# Patient Record
Sex: Female | Born: 1945 | Race: White | Hispanic: No | Marital: Married | Smoking: Never smoker
Health system: Southern US, Community
[De-identification: ages and names within clinical notes are randomized; demographics above are authoritative.]

## PROBLEM LIST (undated history)

## (undated) DIAGNOSIS — M1990 Unspecified osteoarthritis, unspecified site: Secondary | ICD-10-CM

## (undated) DIAGNOSIS — D649 Anemia, unspecified: Secondary | ICD-10-CM

## (undated) DIAGNOSIS — M797 Fibromyalgia: Secondary | ICD-10-CM

## (undated) DIAGNOSIS — F329 Major depressive disorder, single episode, unspecified: Secondary | ICD-10-CM

## (undated) DIAGNOSIS — N3010 Interstitial cystitis (chronic) without hematuria: Secondary | ICD-10-CM

## (undated) DIAGNOSIS — IMO0002 Reserved for concepts with insufficient information to code with codable children: Secondary | ICD-10-CM

## (undated) DIAGNOSIS — N3281 Overactive bladder: Secondary | ICD-10-CM

## (undated) DIAGNOSIS — F32A Depression, unspecified: Secondary | ICD-10-CM

## (undated) DIAGNOSIS — F419 Anxiety disorder, unspecified: Secondary | ICD-10-CM

## (undated) DIAGNOSIS — E785 Hyperlipidemia, unspecified: Secondary | ICD-10-CM

## (undated) HISTORY — DX: Hyperlipidemia, unspecified: E78.5

## (undated) HISTORY — DX: Overactive bladder: N32.81

## (undated) HISTORY — DX: Anemia, unspecified: D64.9

## (undated) HISTORY — PX: TONSILLECTOMY: SHX5217

## (undated) HISTORY — DX: Anxiety disorder, unspecified: F41.9

## (undated) HISTORY — PX: ABDOMINAL HYSTERECTOMY: SHX81

## (undated) HISTORY — DX: Fibromyalgia: M79.7

## (undated) HISTORY — DX: Reserved for concepts with insufficient information to code with codable children: IMO0002

## (undated) HISTORY — DX: Depression, unspecified: F32.A

## (undated) HISTORY — DX: Major depressive disorder, single episode, unspecified: F32.9

## (undated) HISTORY — PX: CHOLECYSTECTOMY: SHX55

## (undated) HISTORY — DX: Unspecified osteoarthritis, unspecified site: M19.90

## (undated) HISTORY — PX: BLADDER SUSPENSION: SHX72

## (undated) HISTORY — PX: BREAST LUMPECTOMY: SHX2

## (undated) HISTORY — DX: Interstitial cystitis (chronic) without hematuria: N30.10

---

## 2007-01-31 ENCOUNTER — Encounter: Admission: RE | Admit: 2007-01-31 | Discharge: 2007-01-31 | Payer: Self-pay | Admitting: Specialist

## 2007-03-15 ENCOUNTER — Encounter: Payer: Self-pay | Admitting: Urology

## 2007-03-15 ENCOUNTER — Ambulatory Visit (HOSPITAL_BASED_OUTPATIENT_CLINIC_OR_DEPARTMENT_OTHER): Admission: RE | Admit: 2007-03-15 | Discharge: 2007-03-15 | Payer: Self-pay | Admitting: Urology

## 2007-07-28 ENCOUNTER — Encounter: Admission: RE | Admit: 2007-07-28 | Discharge: 2007-07-28 | Payer: Self-pay | Admitting: Family Medicine

## 2008-04-23 ENCOUNTER — Ambulatory Visit: Payer: Self-pay | Admitting: Internal Medicine

## 2008-04-23 DIAGNOSIS — F411 Generalized anxiety disorder: Secondary | ICD-10-CM | POA: Insufficient documentation

## 2008-04-23 DIAGNOSIS — D649 Anemia, unspecified: Secondary | ICD-10-CM | POA: Insufficient documentation

## 2008-04-23 DIAGNOSIS — M1990 Unspecified osteoarthritis, unspecified site: Secondary | ICD-10-CM | POA: Insufficient documentation

## 2008-04-23 DIAGNOSIS — K279 Peptic ulcer, site unspecified, unspecified as acute or chronic, without hemorrhage or perforation: Secondary | ICD-10-CM | POA: Insufficient documentation

## 2008-04-23 DIAGNOSIS — E785 Hyperlipidemia, unspecified: Secondary | ICD-10-CM | POA: Insufficient documentation

## 2008-04-23 DIAGNOSIS — F329 Major depressive disorder, single episode, unspecified: Secondary | ICD-10-CM

## 2008-04-23 DIAGNOSIS — R32 Unspecified urinary incontinence: Secondary | ICD-10-CM | POA: Insufficient documentation

## 2008-04-23 LAB — CONVERTED CEMR LAB
AST: 26 units/L (ref 0–37)
Albumin: 4 g/dL (ref 3.5–5.2)
Bilirubin, Direct: 0.1 mg/dL (ref 0.0–0.3)
Calcium: 9.4 mg/dL (ref 8.4–10.5)
Cholesterol: 240 mg/dL (ref 0–200)
Creatinine, Ser: 0.9 mg/dL (ref 0.4–1.2)
Direct LDL: 139.3 mg/dL
Eosinophils Absolute: 0.2 10*3/uL (ref 0.0–0.7)
Eosinophils Relative: 2.7 % (ref 0.0–5.0)
GFR calc Af Amer: 81 mL/min
HCT: 38.8 % (ref 36.0–46.0)
Hemoglobin: 13.2 g/dL (ref 12.0–15.0)
Iron: 71 ug/dL (ref 42–145)
MCHC: 34 g/dL (ref 30.0–36.0)
MCV: 86.7 fL (ref 78.0–100.0)
Monocytes Absolute: 0.5 10*3/uL (ref 0.1–1.0)
Monocytes Relative: 6.8 % (ref 3.0–12.0)
Neutrophils Relative %: 61.1 % (ref 43.0–77.0)
RBC: 4.47 M/uL (ref 3.87–5.11)
RDW: 12.8 % (ref 11.5–14.6)
Sodium: 141 meq/L (ref 135–145)
Total Protein: 6.6 g/dL (ref 6.0–8.3)
Transferrin: 255.1 mg/dL (ref 212.0–360.0)

## 2008-08-07 ENCOUNTER — Encounter: Admission: RE | Admit: 2008-08-07 | Discharge: 2008-08-07 | Payer: Self-pay | Admitting: Internal Medicine

## 2009-01-30 ENCOUNTER — Ambulatory Visit: Payer: Self-pay | Admitting: Internal Medicine

## 2009-07-24 ENCOUNTER — Ambulatory Visit: Payer: Self-pay | Admitting: Internal Medicine

## 2009-07-24 LAB — CONVERTED CEMR LAB
ALT: 18 units/L (ref 0–35)
Albumin: 4.6 g/dL (ref 3.5–5.2)
Alkaline Phosphatase: 73 units/L (ref 39–117)
Basophils Absolute: 0 10*3/uL (ref 0.0–0.1)
Basophils Relative: 0.5 % (ref 0.0–3.0)
Bilirubin, Direct: 0.1 mg/dL (ref 0.0–0.3)
CO2: 31 meq/L (ref 19–32)
Calcium: 10.5 mg/dL (ref 8.4–10.5)
Direct LDL: 145.3 mg/dL
Eosinophils Absolute: 0.2 10*3/uL (ref 0.0–0.7)
Eosinophils Relative: 2 % (ref 0.0–5.0)
Glucose, Bld: 89 mg/dL (ref 70–99)
HCT: 38.4 % (ref 36.0–46.0)
Hemoglobin: 13.3 g/dL (ref 12.0–15.0)
MCHC: 34.7 g/dL (ref 30.0–36.0)
Monocytes Absolute: 0.7 10*3/uL (ref 0.1–1.0)
Monocytes Relative: 7.6 % (ref 3.0–12.0)
Neutro Abs: 6 10*3/uL (ref 1.4–7.7)
Neutrophils Relative %: 69.2 % (ref 43.0–77.0)
Platelets: 302 10*3/uL (ref 150.0–400.0)
Potassium: 4.7 meq/L (ref 3.5–5.1)
RBC: 4.4 M/uL (ref 3.87–5.11)
RDW: 13.1 % (ref 11.5–14.6)
Total Bilirubin: 0.6 mg/dL (ref 0.3–1.2)
Total CHOL/HDL Ratio: 3
Total Protein, Urine: NEGATIVE mg/dL
Total Protein: 7 g/dL (ref 6.0–8.3)
VLDL: 21 mg/dL (ref 0.0–40.0)

## 2009-07-25 ENCOUNTER — Encounter: Payer: Self-pay | Admitting: Internal Medicine

## 2009-08-15 ENCOUNTER — Encounter: Admission: RE | Admit: 2009-08-15 | Discharge: 2009-08-15 | Payer: Self-pay | Admitting: Internal Medicine

## 2009-08-15 LAB — HM MAMMOGRAPHY

## 2010-01-13 ENCOUNTER — Ambulatory Visit: Payer: Self-pay | Admitting: Internal Medicine

## 2010-03-11 NOTE — Assessment & Plan Note (Signed)
Summary: f/u appt/#/cd   Vital Signs:  Patient profile:   65 year old female Height:      67 inches Weight:      156 pounds BMI:     24.52 O2 Sat:      97 % on Room air Temp:     98.3 degrees F oral Pulse rate:   83 / minute Pulse rhythm:   regular Resp:     16 per minute BP sitting:   136 / 82  (left arm) Cuff size:   large  Vitals Entered By: Rock Nephew CMA (July 24, 2009 1:18 PM)  O2 Flow:  Room air  Primary Care Provider:  Etta Grandchild MD   History of Present Illness: Routine physical, no complaints.  Preventive Screening-Counseling & Management  Alcohol-Tobacco     Alcohol drinks/day: <1     Alcohol type: wine     >5/day in last 3 mos: no     Alcohol Counseling: not indicated; use of alcohol is not excessive or problematic     Smoking Status: never     Smoking Cessation Counseling: no  Caffeine-Diet-Exercise     Does Patient Exercise: no  Hep-HIV-STD-Contraception     Hepatitis Risk: no risk noted     HIV Risk: no risk noted     STD Risk: no risk noted     SBE monthly: yes     SBE Education/Counseling: to perform regular SBE     Sun Exposure-Excessive: yes     Sun Exposure Counseling: to decrease sun exposure  Safety-Violence-Falls     Seat Belt Use: yes     Firearms in the Home: no firearms in the home     Smoke Detectors: yes     Violence in the Home: no risk noted     Sexual Abuse: no     Fall Risk Counseling: not indicated; no significant falls noted      Sexual History:  currently monogamous.        Drug Use:  no.        Blood Transfusions:  prior to 2001.    Medications Prior to Update: 1)  Prevacid 30 Mg Cpdr (Lansoprazole) .... Take 1 Tablet By Mouth Two Times A Day 2)  Nitrofurantoin Monohyd Macro 100 Mg Caps (Nitrofurantoin Monohyd Macro) .... Take 1 Tab By Mouth At Bedtime 3)  Cymbalta 30 Mg Cpep (Duloxetine Hcl) .... Once Daily 4)  Vesicare 10 Mg Tabs (Solifenacin Succinate) .... Take 1 Tablet By Mouth Once A Day 5)   Tretinoin 0.025 % Crea (Tretinoin) .... Apply As Directed Bid 6)  Tussicaps 10-8 Mg Xr12h-Cap (Hydrocod Polst-Chlorphen Polst) .... One By Mouth Two Times A Day As Needed For Cough  Current Medications (verified): 1)  Prevacid 30 Mg Cpdr (Lansoprazole) .... Take 1 Tablet By Mouth Two Times A Day 2)  Nitrofurantoin Monohyd Macro 100 Mg Caps (Nitrofurantoin Monohyd Macro) .... Take 1 Tab By Mouth At Bedtime 3)  Cymbalta 30 Mg Cpep (Duloxetine Hcl) .... Once Daily  Allergies (verified): 1)  ! Iodine 2)  ! * Hep B Vaccine  Past History:  Past Medical History: Last updated: 04/23/2008 Anemia-NOS Anxiety Depression Peptic ulcer disease Urinary incontinence OAB Interstitial cystitis Osteoarthritis Hyperlipidemia  Past Surgical History: Last updated: 04/23/2008 Cholecystectomy Hysterectomy Lumpectomy-right Tonsillectomy  Family History: Last updated: 04/23/2008 Family History of CAD Female 1st degree relative <50 Family History High cholesterol Family History Hypertension  Social History: Last updated: 04/23/2008 Retired Married Never Smoked Alcohol use-no  Drug use-no Regular exercise-no  Risk Factors: Alcohol Use: <1 (07/24/2009) >5 drinks/d w/in last 3 months: no (07/24/2009) Exercise: no (07/24/2009)  Risk Factors: Smoking Status: never (07/24/2009)  Family History: Reviewed history from 04/23/2008 and no changes required. Family History of CAD Female 1st degree relative <50 Family History High cholesterol Family History Hypertension  Social History: Reviewed history from 04/23/2008 and no changes required. Retired Married Never Smoked Alcohol use-no Drug use-no Regular exercise-no Sun Exposure-Excessive:  yes  Review of Systems       The patient complains of weight gain.  The patient denies anorexia, fever, chest pain, syncope, dyspnea on exertion, peripheral edema, prolonged cough, headaches, hemoptysis, abdominal pain, hematuria, difficulty walking,  depression, and enlarged lymph nodes.   GI:  Denies abdominal pain, bloody stools, change in bowel habits, dark tarry stools, diarrhea, hemorrhoids, indigestion, loss of appetite, nausea, vomiting, and vomiting blood. Heme:  Denies abnormal bruising, bleeding, enlarge lymph nodes, fevers, pallor, and skin discoloration.  Physical Exam  General:  alert, well-developed, well-nourished, well-hydrated, appropriate dress, normal appearance, healthy-appearing, cooperative to examination, and good hygiene.   Head:  normocephalic, atraumatic, no abnormalities observed, and no abnormalities palpated.   Eyes:  vision grossly intact and pupils equal.   Mouth:  Oral mucosa and oropharynx without lesions or exudates.  Teeth in good repair. Neck:  supple, full ROM, no masses, no thyroid nodules or tenderness, no JVD, and no cervical lymphadenopathy.   Breasts:  skin/areolae normal, no masses, no abnormal thickening, no nipple discharge, no tenderness, and no adenopathy.   Lungs:  normal respiratory effort, no intercostal retractions, no accessory muscle use, normal breath sounds, no dullness, no fremitus, no crackles, and no wheezes.   Heart:  normal rate, regular rhythm, no murmur, no gallop, no rub, and no JVD.   Abdomen:  soft, non-tender, normal bowel sounds, no distention, no masses, no guarding, no rigidity, no rebound tenderness, no abdominal hernia, no inguinal hernia, no hepatomegaly, no splenomegaly, and abdominal scar(s).   Rectal:  No external abnormalities noted. Normal sphincter tone. No rectal masses or tenderness. Msk:  normal ROM, no joint tenderness, no joint swelling, no joint warmth, no redness over joints, no joint deformities, no joint instability, no crepitation, and no muscle atrophy.   Pulses:  R and L carotid,radial,femoral,dorsalis pedis and posterior tibial pulses are full and equal bilaterally Extremities:  No clubbing, cyanosis, edema, or deformity noted with normal full range of  motion of all joints.   Neurologic:  No cranial nerve deficits noted. Station and gait are normal. Plantar reflexes are down-going bilaterally. DTRs are symmetrical throughout. Sensory, motor and coordinative functions appear intact. Skin:  turgor normal, no rashes, no suspicious lesions, no ecchymoses, no petechiae, no purpura, no ulcerations, no edema, and excessive tan.   Cervical Nodes:  no anterior cervical adenopathy and no posterior cervical adenopathy.   Axillary Nodes:  no R axillary adenopathy and no L axillary adenopathy.   Inguinal Nodes:  no R inguinal adenopathy and no L inguinal adenopathy.   Psych:  Oriented X3, memory intact for recent and remote, normally interactive, good eye contact, not depressed appearing, not agitated, not suicidal, and moderately anxious.     Impression & Recommendations:  Problem # 1:  ROUTINE GENERAL MEDICAL [email or web address]  CARE FACL (ICD-V70.0) Assessment Unchanged  Orders: Venipuncture ([address]) TLB-Lipid Panel (80061-LIPID) TLB-BMP (Basic Metabolic Panel-BMET) (80048-METABOL) TLB-CBC Platelet - w/Differential (85025-CBCD) TLB-Hepatic/Liver Function Pnl (80076-HEPATIC) TLB-TSH (Thyroid Stimulating Hormone) (84443-TSH) TLB-Udip w/ Micro (81001-URINE) Hemoccult Guaiac-1 spec.(in office) (82270)  EKG w/ Interpretation (93000)  Mammogram: ASSESSMENT: Negative - BI-RADS 1^MM DIGITAL SCREENING (08/07/2008) Pap smear: Normal (03/23/2006) Chol: 240 (04/23/2008)   HDL: 79.6 (04/23/2008)   LDL: DEL (04/23/2008)   TG: 91 (04/23/2008) TSH: 0.84 (04/23/2008)    Discussed using sunscreen, use of alcohol, drug use, self breast exam, routine dental care, routine eye care, schedule for GYN exam, routine physical exam, seat belts, multiple vitamins, osteoporosis prevention, adequate calcium intake in diet, recommendations for immunizations, mammograms and Pap smears.  Discussed exercise and checking cholesterol.    Complete Medication List: 1)  Prevacid 30 Mg Cpdr  (Lansoprazole) .... Take 1 tablet by mouth two times a day 2)  Nitrofurantoin Monohyd Macro 100 Mg Caps (Nitrofurantoin monohyd macro) .... Take 1 tab by mouth at bedtime 3)  Cymbalta 30 Mg Cpep (Duloxetine hcl) .... Once daily  Colorectal Screening:  Current Recommendations:    Hemoccult: NEG X 1 today  PAP Screening:    Hx Cervical Dysplasia in last 5 yrs? No    3 normal PAP smears in last 5 yrs? Yes    Last PAP smear:  03/23/2006    Reviewed PAP smear recommendations:  patient refuses understanding risks of delayed diagnosis  PAP Smear Results:    Date of Exam:  03/23/2006    Results:  Normal  Mammogram Screening:    Last Mammogram:  08/07/2008  Osteoporosis Risk Assessment:  Risk Factors for Fracture or Low Bone Density:   Race (White or Asian):     yes   Hx of Fractures:       no   FH of Osteoporosis:     no   Hx of falls:       no   Physically inactive:     yes   Smoking status:       never   High alcohol use:     no   High caffeine use:     no   Low calcium/Vit. D intake:   no   Corticosteroid use:     no   Thyroid replacement:     no   Dilantin use:       no   Heparin use:       no   Thyroid disease:     no   Rheumatoid Arthritis:     no   Parathyroid disease:     no  Patient Instructions: 1)  Please schedule a follow-up appointment in 2 months. 2)  It is important that you exercise regularly at least 20 minutes 5 times a week. If you develop chest pain, have severe difficulty breathing, or feel very tired , stop exercising immediately and seek medical attention. 3)  You need to lose weight. Consider a lower calorie diet and regular exercise.  4)  Check your Blood Pressure regularly. If it is above 140/90: you should make an appointment.    Appended Document: f/u appt/#/cd    Clinical Lists Changes  Orders: Added new Service order of Tdap => 18yrs IM (67893) - Signed Added new Service order of Admin 1st Vaccine (81017) - Signed Observations: Added  new observation of TD BOOST VIS: 12/28/06 version given July 24, 2009. (07/24/2009 16:48) Added new observation of TD BOOSTERLO: [phone] (07/24/2009 16:48) Added new observation of TD BOOST EXP: 05/03/2011 (07/24/2009 16:48) Added new observation of TD BOOSTERBY: Lakisha Archie CMA (07/24/2009 16:48) Added new observation of TD BOOSTERRT: IM (07/24/2009 16:48) Added new observation of TDBOOSTERDSE: 0.5 ml (07/24/2009 16:48) Added new observation of  TD BOOSTERMF: GlaxoSmithKline (07/24/2009 16:48) Added new observation of TD BOOST SIT: left deltoid (07/24/2009 16:48) Added new observation of TD BOOSTER: Tdap (07/24/2009 16:48)       Immunizations Administered:  Tetanus Vaccine:    Vaccine Type: Tdap    Site: left deltoid    Mfr: GlaxoSmithKline    Dose: 0.5 ml    Route: IM    Given by: Rock Nephew CMA    Exp. Date: 05/03/2011    Lot #: [id number]    VIS given: 12/28/06 version given July 24, 2009.

## 2010-03-11 NOTE — Letter (Signed)
Summary: Results Follow-up Letter  [facility]  [address] [place], [place] [address]   Phone: [phone]  Fax: [phone]    07/25/2009  [address] [place], [place]  [address]  Dear Ms. Higby,   The following are the results of your recent test(s):  Test     Result Thyroid     normal Liver/kidney   normal CBC       normal Urine       normal  _________________________________________________________  Please call for an appointment as directed _________________________________________________________ _________________________________________________________ _________________________________________________________  Sincerely,  Sanda Linger MD [facility]

## 2010-03-11 NOTE — Assessment & Plan Note (Signed)
Summary: COUGH--SORE THROAT  STC   Vital Signs:  Patient profile:   65 year old female Menstrual status:  postmenopausal Height:      67 inches Weight:      159.50 pounds BMI:     25.07 O2 Sat:      97 % Temp:     98.7 degrees F oral Pulse rate:   72 / minute Pulse rhythm:   regular Resp:     16 per minute BP sitting:   126 / 70  (left arm) Cuff size:   large  Vitals Entered By: Rock Nephew CMA (January 13, 2010 11:26 AM)  Nutrition Counseling: Patient's BMI is greater than 25 and therefore counseled on weight management options. CC: Patient c/o cough w/green pjlem, congestion, sore throat x 01/04/10, URI symptoms Is Patient Diabetic? No Pain Assessment Patient in pain? no       Does patient need assistance? Functional Status Self care Ambulation Normal     Menstrual Status postmenopausal Last PAP Result Normal   Primary Care Provider:  Etta Grandchild MD  CC:  Patient c/o cough w/green pjlem, congestion, sore throat x 01/04/10, and URI symptoms.  History of Present Illness:  URI Symptoms      This is a 65 year old woman who presents with URI symptoms.  The symptoms began 2 weeks ago.  The severity is described as moderate.  The patient reports sore throat, productive cough, and sick contacts, but denies nasal congestion, clear nasal discharge, purulent nasal discharge, dry cough, and earache.  The patient denies fever, stiff neck, dyspnea, wheezing, rash, vomiting, diarrhea, use of an antipyretic, and response to antipyretic.  The patient denies itchy throat, sneezing, seasonal symptoms, headache, muscle aches, and severe fatigue.  Risk factors for Strep sinusitis include double sickening.  The patient denies the following risk factors for Strep sinusitis: unilateral facial pain, unilateral nasal discharge, poor response to decongestant, tooth pain, Strep exposure, tender adenopathy, and absence of cough.    Preventive Screening-Counseling &  Management  Alcohol-Tobacco     Alcohol drinks/day: <1     Alcohol type: wine     >5/day in last 3 mos: no     Alcohol Counseling: not indicated; use of alcohol is not excessive or problematic     Smoking Status: never     Smoking Cessation Counseling: no     Tobacco Counseling: not indicated; no tobacco use  Hep-HIV-STD-Contraception     Hepatitis Risk: no risk noted     HIV Risk: no risk noted     STD Risk: no risk noted     SBE monthly: yes     SBE Education/Counseling: to perform regular SBE     Sun Exposure-Excessive: yes     Sun Exposure Counseling: to decrease sun exposure      Sexual History:  currently monogamous.        Drug Use:  no.        Blood Transfusions:  prior to 2001.    Clinical Review Panels:  Prevention   Last Mammogram:  ASSESSMENT: Negative - BI-RADS 1^MM DIGITAL SCREENING (08/15/2009)   Last Pap Smear:  Normal (03/23/2006)  Immunizations   Last Tetanus Booster:  Tdap (07/24/2009)   Last Zoster Vaccine:  Zostavax (01/13/2010)  Lipid Management   Cholesterol:  280 (07/24/2009)   LDL (bad choesterol):  DEL (04/23/2008)   HDL (good cholesterol):  99.10 (07/24/2009)  Diabetes Management   Creatinine:  0.7 (07/24/2009)  CBC  WBC:  8.6 (07/24/2009)   RBC:  4.40 (07/24/2009)   Hgb:  13.3 (07/24/2009)   Hct:  38.4 (07/24/2009)   Platelets:  302.0 (07/24/2009)   MCV  87.4 (07/24/2009)   MCHC  34.7 (07/24/2009)   RDW  13.1 (07/24/2009)   PMN:  69.2 (07/24/2009)   Lymphs:  20.7 (07/24/2009)   Monos:  7.6 (07/24/2009)   Eosinophils:  2.0 (07/24/2009)   Basophil:  0.5 (07/24/2009)  Complete Metabolic Panel   Glucose:  89 (07/24/2009)   Sodium:  140 (07/24/2009)   Potassium:  4.7 (07/24/2009)   Chloride:  101 (07/24/2009)   CO2:  31 (07/24/2009)   BUN:  21 (07/24/2009)   Creatinine:  0.7 (07/24/2009)   Albumin:  4.6 (07/24/2009)   Total Protein:  7.0 (07/24/2009)   Calcium:  10.5 (07/24/2009)   Total Bili:  0.6 (07/24/2009)   Alk Phos:   73 (07/24/2009)   SGPT (ALT):  18 (07/24/2009)   SGOT (AST):  21 (07/24/2009)   Medications Prior to Update: 1)  Prevacid 30 Mg Cpdr (Lansoprazole) .... Take 1 Tablet By Mouth Two Times A Day 2)  Nitrofurantoin Monohyd Macro 100 Mg Caps (Nitrofurantoin Monohyd Macro) .... Take 1 Tab By Mouth At Bedtime 3)  Cymbalta 30 Mg Cpep (Duloxetine Hcl) .... Once Daily  Current Medications (verified): 1)  Prevacid 30 Mg Cpdr (Lansoprazole) .... Take 1 Tablet By Mouth Two Times A Day 2)  Nitrofurantoin Monohyd Macro 100 Mg Caps (Nitrofurantoin Monohyd Macro) .... Take 1 Tab By Mouth At Bedtime 3)  Cymbalta 30 Mg Cpep (Duloxetine Hcl) .... Once Daily 4)  Zithromax Tri-Pak 500 Mg Tab (Azithromycin) .... Take One By Mouth Once Daily For 3 Days 5)  Mytussin Ac 100-10 Mg/13ml Syrp (Guaifenesin-Codeine) .... 5-10 Ml By Mouth Qid As Needed For Cough  Allergies (verified): 1)  ! Iodine 2)  ! * Hep B Vaccine  Past History:  Past Medical History: Last updated: 04/23/2008 Anemia-NOS Anxiety Depression Peptic ulcer disease Urinary incontinence OAB Interstitial cystitis Osteoarthritis Hyperlipidemia  Past Surgical History: Last updated: 04/23/2008 Cholecystectomy Hysterectomy Lumpectomy-right Tonsillectomy  Family History: Last updated: 04/23/2008 Family History of CAD Female 1st degree relative <50 Family History High cholesterol Family History Hypertension  Social History: Last updated: 04/23/2008 Retired Married Never Smoked Alcohol use-no Drug use-no Regular exercise-no  Risk Factors: Alcohol Use: <1 (01/13/2010) >5 drinks/d w/in last 3 months: no (01/13/2010) Exercise: no (07/24/2009)  Risk Factors: Smoking Status: never (01/13/2010)  Family History: Reviewed history from 04/23/2008 and no changes required. Family History of CAD Female 1st degree relative <50 Family History High cholesterol Family History Hypertension  Social History: Reviewed history from 04/23/2008  and no changes required. Retired Married Never Smoked Alcohol use-no Drug use-no Regular exercise-no  Review of Systems  The patient denies anorexia, fever, weight loss, weight gain, chest pain, syncope, dyspnea on exertion, peripheral edema, prolonged cough, headaches, hemoptysis, abdominal pain, suspicious skin lesions, unusual weight change, enlarged lymph nodes, and angioedema.    Physical Exam  General:  alert, well-developed, well-nourished, well-hydrated, appropriate dress, normal appearance, healthy-appearing, cooperative to examination, and good hygiene.   Head:  normocephalic, atraumatic, no abnormalities observed, and no abnormalities palpated.   Ears:  R ear normal and L ear normal.   Mouth:  Oral mucosa and oropharynx without lesions or exudates.  Teeth in good repair. Neck:  supple, full ROM, no masses, no thyroid nodules or tenderness, no JVD, and no cervical lymphadenopathy.   Lungs:  normal respiratory effort, no intercostal  retractions, no accessory muscle use, normal breath sounds, no dullness, no fremitus, no crackles, and no wheezes.   Heart:  normal rate, regular rhythm, no murmur, no gallop, no rub, and no JVD.   Abdomen:  soft, non-tender, normal bowel sounds, no distention, no masses, no guarding, no rigidity, no rebound tenderness, no abdominal hernia, no inguinal hernia, no hepatomegaly, no splenomegaly, and abdominal scar(s).   Msk:  normal ROM, no joint tenderness, no joint swelling, no joint warmth, no redness over joints, no joint deformities, no joint instability, no crepitation, and no muscle atrophy.   Pulses:  R and L carotid,radial,femoral,dorsalis pedis and posterior tibial pulses are full and equal bilaterally Extremities:  No clubbing, cyanosis, edema, or deformity noted with normal full range of motion of all joints.   Neurologic:  No cranial nerve deficits noted. Station and gait are normal. Plantar reflexes are down-going bilaterally. DTRs are  symmetrical throughout. Sensory, motor and coordinative functions appear intact. Skin:  turgor normal, no rashes, no suspicious lesions, no ecchymoses, no petechiae, no purpura, no ulcerations, no edema, and excessive tan.   Cervical Nodes:  no anterior cervical adenopathy and no posterior cervical adenopathy.   Psych:  Oriented X3, memory intact for recent and remote, normally interactive, good eye contact, not depressed appearing, not agitated, not suicidal, and moderately anxious.     Impression & Recommendations:  Problem # 1:  BRONCHITIS-ACUTE (ICD-466.0) Assessment New  Her updated medication list for this problem includes:    Zithromax Tri-pak 500 Mg Tab (Azithromycin) .Marland Kitchen... Take one by mouth once daily for 3 days    Mytussin Ac 100-10 Mg/48ml Syrp (Guaifenesin-codeine) .Marland Kitchen... 5-10 ml by mouth qid as needed for cough  Take antibiotics and other medications as directed. Encouraged to push clear liquids, get enough rest, and take acetaminophen as needed. To be seen in 5-7 days if no improvement, sooner if worse.  Complete Medication List: 1)  Prevacid 30 Mg Cpdr (Lansoprazole) .... Take 1 tablet by mouth two times a day 2)  Nitrofurantoin Monohyd Macro 100 Mg Caps (Nitrofurantoin monohyd macro) .... Take 1 tab by mouth at bedtime 3)  Cymbalta 30 Mg Cpep (Duloxetine hcl) .... Once daily 4)  Zithromax Tri-pak 500 Mg Tab (Azithromycin) .... Take one by mouth once daily for 3 days 5)  Mytussin Ac 100-10 Mg/27ml Syrp (Guaifenesin-codeine) .... 5-10 ml by mouth qid as needed for cough  Other Orders: Zoster (Shingles) Vaccine Live (40981) Admin 1st Vaccine (19147)  Patient Instructions: 1)  Please schedule a follow-up appointment in 1 month. 2)  Take your antibiotic as prescribed until ALL of it is gone, but stop if you develop a rash or swelling and contact our office as soon as possible. 3)  Acute bronchitis symptoms for less than 10 days are not helped by antibiotics. take over the  counter cough medications. call if no improvment in  5-7 days, sooner if increasing cough, fever, or new symptoms( shortness of breath, chest pain). Prescriptions: MYTUSSIN AC 100-10 MG/5ML SYRP (GUAIFENESIN-CODEINE) 5-10 ml by mouth QID as needed for cough  #8 ounces x 0   Entered and Authorized by:   Etta Grandchild MD   Signed by:   Etta Grandchild MD on 01/13/2010   Method used:   Print then Give to Patient   RxID:   [id number] ZITHROMAX TRI-PAK 500 MG TAB (AZITHROMYCIN) Take one by mouth once daily for 3 days  #3 x 0   Entered and Authorized by:   Bernadene Bell.  Yetta Barre MD   Signed by:   Etta Grandchild MD on 01/13/2010   Method used:   Print then Give to Patient   RxID:   [id number]    Orders Added: 1)  Zoster (Shingles) Vaccine Live [90736] 2)  Admin 1st Vaccine [90471] 3)  Est. Patient Level IV [14782]   Immunizations Administered:  Zostavax # 1:    Vaccine Type: Zostavax    Site: left deltoid    Mfr: Merck    Dose: 0.65    Route: IM    Given by: Rock Nephew CMA    Exp. Date: 09/13/2010    Lot #: [id number]    VIS given: 11/21/04 given January 13, 2010.   Immunizations Administered:  Zostavax # 1:    Vaccine Type: Zostavax    Site: left deltoid    Mfr: Merck    Dose: 0.65    Route: IM    Given by: Rock Nephew CMA    Exp. Date: 09/13/2010    Lot #: [id number]    VIS given: 11/21/04 given January 13, 2010.

## 2010-03-11 NOTE — Letter (Signed)
Summary: Lipid Letter  [facility]  [address] [place], [place] [address]   Phone: [phone]  Fax: [phone]    07/25/2009  Megon Kalina [address] [place], [place]  [address]  Dear Ms. Blumenfeld:  We have carefully reviewed your last lipid profile from 04/23/2008 and the results are noted below with a summary of recommendations for lipid management.    Cholesterol:       280     Goal: <200   HDL "good" Cholesterol:   10.27     Goal: >40   LDL "bad" Cholesterol:   145     Goal: <130   Triglycerides:       105.0     Goal: <150    AS I HAVE SAID BEFORE THE HIGH HDL IS A VERY GOOD THING! let me know if you want to take a cholesterol medication    TLC Diet (Therapeutic Lifestyle Change): Saturated Fats & Transfatty acids should be kept < 7% of total calories ***Reduce Saturated Fats Polyunstaurated Fat can be up to 10% of total calories Monounsaturated Fat Fat can be up to 20% of total calories Total Fat should be no greater than 25-35% of total calories Carbohydrates should be 50-60% of total calories Protein should be approximately 15% of total calories Fiber should be at least 20-30 grams a day ***Increased fiber may help lower LDL Total Cholesterol should be < 200mg /day Consider adding plant stanol/sterols to diet (example: Benacol spread) ***A higher intake of unsaturated fat may reduce Triglycerides and Increase HDL    Adjunctive Measures (may lower LIPIDS and reduce risk of Heart Attack) include: Aerobic Exercise (20-30 minutes 3-4 times a week) Limit Alcohol Consumption Weight Reduction Aspirin 75-81 mg a day by mouth (if not allergic or contraindicated) Dietary Fiber 20-30 grams a day by mouth     Current Medications: 1)    Prevacid 30 Mg Cpdr (Lansoprazole) .... Take 1 tablet by mouth two times a day 2)    Nitrofurantoin Monohyd Macro 100 Mg Caps (Nitrofurantoin monohyd macro) .... Take 1 tab by mouth at bedtime 3)    Cymbalta 30 Mg  Cpep (Duloxetine hcl) .... Once daily  If you have any questions, please call. We appreciate being able to work with you.   Sincerely,    [facility] Etta Grandchild MD

## 2010-03-17 ENCOUNTER — Encounter: Payer: Self-pay | Admitting: Internal Medicine

## 2010-03-17 ENCOUNTER — Encounter (INDEPENDENT_AMBULATORY_CARE_PROVIDER_SITE_OTHER): Payer: Self-pay | Admitting: *Deleted

## 2010-03-17 ENCOUNTER — Other Ambulatory Visit: Payer: Medicare Other

## 2010-03-17 ENCOUNTER — Other Ambulatory Visit: Payer: Self-pay | Admitting: Internal Medicine

## 2010-03-17 ENCOUNTER — Ambulatory Visit (INDEPENDENT_AMBULATORY_CARE_PROVIDER_SITE_OTHER): Payer: Medicare Other | Admitting: Internal Medicine

## 2010-03-17 DIAGNOSIS — K279 Peptic ulcer, site unspecified, unspecified as acute or chronic, without hemorrhage or perforation: Secondary | ICD-10-CM

## 2010-03-17 DIAGNOSIS — M1990 Unspecified osteoarthritis, unspecified site: Secondary | ICD-10-CM

## 2010-03-17 DIAGNOSIS — IMO0001 Reserved for inherently not codable concepts without codable children: Secondary | ICD-10-CM

## 2010-03-17 DIAGNOSIS — M2550 Pain in unspecified joint: Secondary | ICD-10-CM

## 2010-03-17 DIAGNOSIS — E785 Hyperlipidemia, unspecified: Secondary | ICD-10-CM

## 2010-03-17 DIAGNOSIS — D649 Anemia, unspecified: Secondary | ICD-10-CM

## 2010-03-17 LAB — CBC WITH DIFFERENTIAL/PLATELET
Basophils Absolute: 0 10*3/uL (ref 0.0–0.1)
Basophils Relative: 0.6 % (ref 0.0–3.0)
Eosinophils Absolute: 0.3 10*3/uL (ref 0.0–0.7)
Eosinophils Relative: 3.7 % (ref 0.0–5.0)
HCT: 37.9 % (ref 36.0–46.0)
Hemoglobin: 13.1 g/dL (ref 12.0–15.0)
Lymphocytes Relative: 27.3 % (ref 12.0–46.0)
Lymphs Abs: 2 10*3/uL (ref 0.7–4.0)
MCHC: 34.5 g/dL (ref 30.0–36.0)
MCV: 86.6 fl (ref 78.0–100.0)
Monocytes Absolute: 0.7 10*3/uL (ref 0.1–1.0)
Monocytes Relative: 9.3 % (ref 3.0–12.0)
Neutro Abs: 4.3 10*3/uL (ref 1.4–7.7)
Neutrophils Relative %: 59.1 % (ref 43.0–77.0)
Platelets: 277 10*3/uL (ref 150.0–400.0)
RBC: 4.38 Mil/uL (ref 3.87–5.11)
RDW: 13.1 % (ref 11.5–14.6)
WBC: 7.3 10*3/uL (ref 4.5–10.5)

## 2010-03-17 LAB — BASIC METABOLIC PANEL
BUN: 19 mg/dL (ref 6–23)
CO2: 29 mEq/L (ref 19–32)
Calcium: 9.8 mg/dL (ref 8.4–10.5)
Chloride: 104 mEq/L (ref 96–112)
GFR: 75.29 mL/min (ref >60.00)
Glucose, Bld: 73 mg/dL (ref 70–99)

## 2010-03-17 LAB — HIGH SENSITIVITY CRP: CRP, High Sensitivity: 4.69 mg/L (ref 0.00–5.00)

## 2010-03-17 LAB — IBC PANEL: Transferrin: 265.7 mg/dL (ref 212.0–360.0)

## 2010-03-17 LAB — TSH: TSH: 1.06 u[IU]/mL (ref 0.35–5.50)

## 2010-03-17 LAB — CONVERTED CEMR LAB
Anti Nuclear Antibody(ANA): NEGATIVE
Rhuematoid fact SerPl-aCnc: 12 intl units/mL (ref <=14)
Vit D, 25-Hydroxy: 21 ng/mL — ABNORMAL LOW (ref 30–89)

## 2010-03-17 LAB — SEDIMENTATION RATE: Sed Rate: 10 mm/hr (ref 0–22)

## 2010-03-17 LAB — B12 AND FOLATE PANEL
Folate: 23.5 ng/mL (ref >5.9)
Vitamin B-12: 865 pg/mL (ref 211–911)

## 2010-03-17 LAB — CK: Total CK: 80 U/L (ref 7–177)

## 2010-03-17 LAB — HEPATIC FUNCTION PANEL: ALT: 18 U/L (ref 0–35)

## 2010-03-18 ENCOUNTER — Encounter: Payer: Self-pay | Admitting: Internal Medicine

## 2010-03-19 ENCOUNTER — Telehealth: Payer: Self-pay | Admitting: Internal Medicine

## 2010-03-27 NOTE — Assessment & Plan Note (Signed)
Summary: JOINT PAIN  X  2 WKS--STC   Vital Signs:  Patient profile:   65 year old female Menstrual status:  postmenopausal Height:      67 inches Weight:      161 pounds BMI:     25.31 O2 Sat:      97 % on Room air Temp:     98.6 degrees F oral Pulse rate:   66 / minute Pulse rhythm:   regular Resp:     16 per minute BP sitting:   110 / 70  (left arm) Cuff size:   regular  Vitals Entered By: Lanier Prude, CMA(AAMA) (March 17, 2010 1:34 PM)  Nutrition Counseling: Patient's BMI is greater than 25 and therefore counseled on weight management options.  O2 Flow:  Room air CC: joint pain X 3 wks, fatigue and shooting pains in head Is Patient Diabetic? No Pain Assessment Patient in pain? yes      Comments pt is not taking MyTussin   Primary Care Provider:  Etta Grandchild MD  CC:  joint pain X 3 wks and fatigue and shooting pains in head.  History of Present Illness: She returns c/o a 3 week history of diffuse muscle and large joint pain with fatigue.  Preventive Screening-Counseling & Management  Alcohol-Tobacco     Alcohol drinks/day: 2     Alcohol type: wine     >5/day in last 3 mos: no     Alcohol Counseling: not indicated; use of alcohol is not excessive or problematic     Feels need to cut down: no     Feels annoyed by complaints: no     Feels guilty re: drinking: no     Needs 'eye opener' in am: no     Smoking Status: never     Smoking Cessation Counseling: no     Tobacco Counseling: not indicated; no tobacco use  Hep-HIV-STD-Contraception     Hepatitis Risk: no risk noted     HIV Risk: no risk noted     STD Risk: no risk noted     SBE monthly: yes     SBE Education/Counseling: to perform regular SBE     Sun Exposure-Excessive: yes     Sun Exposure Counseling: to decrease sun exposure      Sexual History:  currently monogamous.        Drug Use:  no.        Blood Transfusions:  prior to 2001.    Clinical Review Panels:  Prevention   Last  Mammogram:  ASSESSMENT: Negative - BI-RADS 1^MM DIGITAL SCREENING (08/15/2009)   Last Pap Smear:  Normal (03/23/2006)  Immunizations   Last Tetanus Booster:  Tdap (07/24/2009)   Last Zoster Vaccine:  Zostavax (01/13/2010)  Lipid Management   Cholesterol:  280 (07/24/2009)   LDL (bad choesterol):  DEL (04/23/2008)   HDL (good cholesterol):  99.10 (07/24/2009)  Diabetes Management   Creatinine:  0.7 (07/24/2009)  CBC   WBC:  8.6 (07/24/2009)   RBC:  4.40 (07/24/2009)   Hgb:  13.3 (07/24/2009)   Hct:  38.4 (07/24/2009)   Platelets:  302.0 (07/24/2009)   MCV  87.4 (07/24/2009)   MCHC  34.7 (07/24/2009)   RDW  13.1 (07/24/2009)   PMN:  69.2 (07/24/2009)   Lymphs:  20.7 (07/24/2009)   Monos:  7.6 (07/24/2009)   Eosinophils:  2.0 (07/24/2009)   Basophil:  0.5 (07/24/2009)  Complete Metabolic Panel   Glucose:  89 (07/24/2009)  Sodium:  140 (07/24/2009)   Potassium:  4.7 (07/24/2009)   Chloride:  101 (07/24/2009)   CO2:  31 (07/24/2009)   BUN:  21 (07/24/2009)   Creatinine:  0.7 (07/24/2009)   Albumin:  4.6 (07/24/2009)   Total Protein:  7.0 (07/24/2009)   Calcium:  10.5 (07/24/2009)   Total Bili:  0.6 (07/24/2009)   Alk Phos:  73 (07/24/2009)   SGPT (ALT):  18 (07/24/2009)   SGOT (AST):  21 (07/24/2009)   Medications Prior to Update: 1)  Prevacid 30 Mg Cpdr (Lansoprazole) .... Take 1 Tablet By Mouth Two Times A Day 2)  Nitrofurantoin Monohyd Macro 100 Mg Caps (Nitrofurantoin Monohyd Macro) .... Take 1 Tab By Mouth At Bedtime 3)  Cymbalta 30 Mg Cpep (Duloxetine Hcl) .... Once Daily 4)  Mytussin Ac 100-10 Mg/21ml Syrp (Guaifenesin-Codeine) .... 5-10 Ml By Mouth Qid As Needed For Cough  Current Medications (verified): 1)  Prevacid 30 Mg Cpdr (Lansoprazole) .... Take 1 Tablet By Mouth Two Times A Day 2)  Nitrofurantoin Monohyd Macro 100 Mg Caps (Nitrofurantoin Monohyd Macro) .... Take 1 Tab By Mouth At Bedtime 3)  Cymbalta 30 Mg Cpep (Duloxetine Hcl) .... Once Daily 4)   Mytussin Ac 100-10 Mg/74ml Syrp (Guaifenesin-Codeine) .... 5-10 Ml By Mouth Qid As Needed For Cough 5)  Tramadol Hcl 50 Mg Tabs (Tramadol Hcl) .... One By Mouth Qid As Needed For Pain  Allergies (verified): 1)  ! Iodine 2)  ! * Hep B Vaccine  Past History:  Past Medical History: Last updated: 04/23/2008 Anemia-NOS Anxiety Depression Peptic ulcer disease Urinary incontinence OAB Interstitial cystitis Osteoarthritis Hyperlipidemia  Past Surgical History: Last updated: 04/23/2008 Cholecystectomy Hysterectomy Lumpectomy-right Tonsillectomy  Family History: Last updated: 04/23/2008 Family History of CAD Female 1st degree relative <50 Family History High cholesterol Family History Hypertension  Social History: Last updated: 04/23/2008 Retired Married Never Smoked Alcohol use-no Drug use-no Regular exercise-no  Risk Factors: Alcohol Use: 2 (03/17/2010) >5 drinks/d w/in last 3 months: no (03/17/2010) Exercise: no (07/24/2009)  Risk Factors: Smoking Status: never (03/17/2010)  Family History: Reviewed history from 04/23/2008 and no changes required. Family History of CAD Female 1st degree relative <50 Family History High cholesterol Family History Hypertension  Social History: Reviewed history from 04/23/2008 and no changes required. Retired Married Never Smoked Alcohol use-no Drug use-no Regular exercise-no  Review of Systems       The patient complains of weight gain.  The patient denies anorexia, fever, weight loss, chest pain, syncope, dyspnea on exertion, peripheral edema, prolonged cough, headaches, hemoptysis, abdominal pain, melena, hematochezia, severe indigestion/heartburn, hematuria, muscle weakness, suspicious skin lesions, transient blindness, difficulty walking, depression, unusual weight change, abnormal bleeding, enlarged lymph nodes, angioedema, and breast masses.   General:  Complains of chills, fatigue, and sleep disorder; denies fever, loss  of appetite, malaise, sweats, weakness, and weight loss. GI:  Denies abdominal pain, bloody stools, change in bowel habits, dark tarry stools, indigestion, loss of appetite, nausea, vomiting, vomiting blood, and yellowish skin color. MS:  Complains of joint pain, muscle aches, muscle weakness, and stiffness; denies joint redness, joint swelling, loss of strength, low back pain, mid back pain, muscle, cramps, and thoracic pain.  Physical Exam  General:  alert, well-developed, well-nourished, well-hydrated, appropriate dress, normal appearance, healthy-appearing, cooperative to examination, good hygiene, and overweight-appearing.   Head:  normocephalic, atraumatic, no abnormalities observed, and no abnormalities palpated.   Eyes:  vision grossly intact, pupils equal, and no injection or icterus. Ears:  R ear normal and L  ear normal.   Nose:  External nasal examination shows no deformity or inflammation. Nasal mucosa are pink and moist without lesions or exudates. Mouth:  Oral mucosa and oropharynx without lesions or exudates.  Teeth in good repair. Neck:  supple, full ROM, no masses, no thyromegaly, no thyroid nodules or tenderness, no JVD, no HJR, normal carotid upstroke, no carotid bruits, no cervical lymphadenopathy, and no neck tenderness.   Lungs:  normal respiratory effort, no intercostal retractions, no accessory muscle use, normal breath sounds, no dullness, no fremitus, no crackles, and no wheezes.   Heart:  normal rate, regular rhythm, no murmur, no gallop, no rub, and no JVD.   Abdomen:  soft, non-tender, normal bowel sounds, no distention, no masses, no guarding, no rigidity, no rebound tenderness, no abdominal hernia, no inguinal hernia, no hepatomegaly, no splenomegaly, and abdominal scar(s).   Msk:  normal ROM, no joint tenderness, no joint swelling, no joint warmth, no redness over joints, no joint deformities, no joint instability, no crepitation, and no muscle atrophy.   Pulses:  R  and L carotid,radial,femoral,dorsalis pedis and posterior tibial pulses are full and equal bilaterally Extremities:  No clubbing, cyanosis, edema, or deformity noted with normal full range of motion of all joints.   Neurologic:  No cranial nerve deficits noted. Station and gait are normal. Plantar reflexes are down-going bilaterally. DTRs are symmetrical throughout. Sensory, motor and coordinative functions appear intact. Skin:  turgor normal, color normal, no rashes, no suspicious lesions, no ecchymoses, no petechiae, no purpura, no ulcerations, and no edema.   Cervical Nodes:  no anterior cervical adenopathy and no posterior cervical adenopathy.   Axillary Nodes:  no R axillary adenopathy and no L axillary adenopathy.   Inguinal Nodes:  no R inguinal adenopathy and no L inguinal adenopathy.   Psych:  Cognition and judgment appear intact. Alert and cooperative with normal attention span and concentration. No apparent delusions, illusions, hallucinations   Impression & Recommendations:  Problem # 1:  PAIN IN JOINT, SITE UNSPECIFIED (ICD-719.40) Assessment New will check for inflammatory arthropathy Orders: Venipuncture ([address]) TLB-IBC Pnl (Iron/FE;Transferrin) (83550-IBC) TLB-B12 + Folate Pnl (60454_09811-B14/NWG) TLB-BMP (Basic Metabolic Panel-BMET) (80048-METABOL) TLB-CBC Platelet - w/Differential (85025-CBCD) TLB-Hepatic/Liver Function Pnl (80076-HEPATIC) TLB-TSH (Thyroid Stimulating Hormone) (84443-TSH) TLB-CRP-High Sensitivity (C-Reactive Protein) (86140-FCRP) TLB-CK Total Only(Creatine Kinase/CPK) (82550-CK) TLB-Sedimentation Rate (ESR) (85652-ESR) TLB-Mono Test (Monospot) [phone]) T-Antinuclear Antib (ANA) [phone]) T-Rheumatoid Factor [phone]) T-Vitamin D (25-Hydroxy) (02725-36644)  Problem # 2:  MYALGIA (ICD-729.1) Assessment: New will check for myopathy Her updated medication list for this problem includes:    Tramadol Hcl 50 Mg Tabs (Tramadol hcl) ..... One  by mouth qid as needed for pain  Orders: Venipuncture (03474) TLB-IBC Pnl (Iron/FE;Transferrin) (83550-IBC) TLB-B12 + Folate Pnl (25956_38756-E33/IRJ) TLB-BMP (Basic Metabolic Panel-BMET) (80048-METABOL) TLB-CBC Platelet - w/Differential (85025-CBCD) TLB-Hepatic/Liver Function Pnl (80076-HEPATIC) TLB-TSH (Thyroid Stimulating Hormone) (84443-TSH) TLB-CRP-High Sensitivity (C-Reactive Protein) (86140-FCRP) TLB-CK Total Only(Creatine Kinase/CPK) (82550-CK) TLB-Sedimentation Rate (ESR) (85652-ESR) TLB-Mono Test (Monospot) [phone]) T-Antinuclear Antib (ANA) [phone]) T-Rheumatoid Factor [phone]) T-Vitamin D (25-Hydroxy) (427[address]-23762)  Problem # 3:  PEPTIC ULCER DISEASE (ICD-533.90) Assessment: Unchanged  Her updated medication list for this problem includes:    Prevacid 30 Mg Cpdr (Lansoprazole) .Marland Kitchen... Take 1 tablet by mouth two times a day  Orders: Venipuncture (83151) TLB-IBC Pnl (Iron/FE;Transferrin) (83550-IBC) TLB-B12 + Folate Pnl (76160_73710-G26/RSW) TLB-BMP (Basic Metabolic Panel-BMET) (80048-METABOL) TLB-CBC Platelet - w/Differential (85025-CBCD) TLB-Hepatic/Liver Function Pnl (80076-HEPATIC) TLB-TSH (Thyroid Stimulating Hormone) (84443-TSH) TLB-CRP-High Sensitivity (C-Reactive Protein) (86140-FCRP) TLB-CK Total Only(Creatine Kinase/CPK) (82550-CK) TLB-Sedimentation Rate (ESR) (85652-ESR)  TLB-Mono Test (Monospot) [phone]) T-Antinuclear Antib (ANA) [phone]) T-Rheumatoid Factor [id number]) T-Vitamin D (25-Hydroxy) [phone])  Problem # 4:  OSTEOARTHRITIS (ICD-715.90) Assessment: Deteriorated  Her updated medication list for this problem includes:    Tramadol Hcl 50 Mg Tabs (Tramadol hcl) ..... One by mouth qid as needed for pain  Orders: Venipuncture ([address]) TLB-IBC Pnl (Iron/FE;Transferrin) (83550-IBC) TLB-B12 + Folate Pnl (32440_10272-Z36/UYQ) TLB-BMP (Basic Metabolic Panel-BMET) (80048-METABOL) TLB-CBC Platelet - w/Differential  (85025-CBCD) TLB-Hepatic/Liver Function Pnl (80076-HEPATIC) TLB-TSH (Thyroid Stimulating Hormone) (84443-TSH) TLB-CRP-High Sensitivity (C-Reactive Protein) (86140-FCRP) TLB-CK Total Only(Creatine Kinase/CPK) (82550-CK) TLB-Sedimentation Rate (ESR) (85652-ESR) TLB-Mono Test (Monospot) [phone]) T-Antinuclear Antib (ANA) [phone]) T-Rheumatoid Factor [phone]) T-Vitamin D (25-Hydroxy) (63016-01093)  Problem # 5:  ANEMIA-NOS (ICD-285.9) Assessment: Unchanged  Orders: Venipuncture (23557) TLB-IBC Pnl (Iron/FE;Transferrin) (83550-IBC) TLB-B12 + Folate Pnl (32202_54270-W23/JSE) TLB-BMP (Basic Metabolic Panel-BMET) (80048-METABOL) TLB-CBC Platelet - w/Differential (85025-CBCD) TLB-Hepatic/Liver Function Pnl (80076-HEPATIC) TLB-TSH (Thyroid Stimulating Hormone) (84443-TSH) TLB-CRP-High Sensitivity (C-Reactive Protein) (86140-FCRP) TLB-CK Total Only(Creatine Kinase/CPK) (82550-CK) TLB-Sedimentation Rate (ESR) (85652-ESR) TLB-Mono Test (Monospot) ([phone]) T-Antinuclear Antib (ANA) ([phone]) T-Rheumatoid Factor ([phone]) T-Vitamin D (25-Hydroxy) [phone])  Hgb: 13.3 (07/24/2009)   Hct: 38.4 (07/24/2009)   Platelets: 302.0 (07/24/2009) RBC: 4.40 (07/24/2009)   RDW: 13.1 (07/24/2009)   WBC: 8.6 (07/24/2009) MCV: 87.4 (07/24/2009)   MCHC: 34.7 (07/24/2009) Iron: 71 (04/23/2008)   % Sat: 19.9 (04/23/2008) TSH: 0.91 (07/24/2009)  Complete Medication List: 1)  Prevacid 30 Mg Cpdr (Lansoprazole) .... Take 1 tablet by mouth two times a day 2)  Nitrofurantoin Monohyd Macro 100 Mg Caps (Nitrofurantoin monohyd macro) .... Take 1 tab by mouth at bedtime 3)  Cymbalta 30 Mg Cpep (Duloxetine hcl) .... Once daily 4)  Tramadol Hcl 50 Mg Tabs (Tramadol hcl) .... One by mouth qid as needed for pain  Patient Instructions: 1)  Please schedule a follow-up appointment in 2 weeks. 2)  It is important that you exercise regularly at least 20 minutes 5 times a week. If you develop  chest pain, have severe difficulty breathing, or feel very tired , stop exercising immediately and seek medical attention. 3)  You need to lose weight. Consider a lower calorie diet and regular exercise.  4)  Take 650-1000mg  of Tylenol every 4-6 hours as needed for relief of pain or comfort of fever AVOID taking more than 4000mg   in a 24 hour period (can cause liver damage in higher doses). Prescriptions: TRAMADOL HCL 50 MG TABS (TRAMADOL HCL) One by mouth QID as needed for pain  #75 x 11   Entered and Authorized by:   Etta Grandchild MD   Signed by:   Etta Grandchild MD on 03/17/2010   Method used:   Electronically to        [facility]  [place] [address] (retail)       [address] [place] [address]       [place], [place]  [address]       Ph: [phone] or [phone]       Fax: [phone]   RxID:   [id number]    Orders Added: 1)  Venipuncture [32671] 2)  TLB-IBC Pnl (Iron/FE;Transferrin) [83550-IBC] 3)  TLB-B12 + Folate Pnl [82746_82607-B12/FOL] 4)  TLB-BMP (Basic Metabolic Panel-BMET) [80048-METABOL] 5)  TLB-CBC Platelet - w/Differential [85025-CBCD] 6)  TLB-Hepatic/Liver Function Pnl [80076-HEPATIC] 7)  TLB-TSH (Thyroid Stimulating Hormone) [84443-TSH] 8)  TLB-CRP-High Sensitivity (C-Reactive Protein) [86140-FCRP] 9)  TLB-CK Total Only(Creatine Kinase/CPK) [82550-CK] 10)  TLB-Sedimentation Rate (ESR) [85652-ESR] 11)  TLB-Mono Test (Monospot) [86308-MONO] 12)  T-Antinuclear Antib (ANA) [24580-99833] 13)  T-Rheumatoid  Factor [phone] 14)  T-Vitamin D (25-Hydroxy) [82306-85810] 15)  Est. Patient Level IV [[address]]

## 2010-03-27 NOTE — Letter (Signed)
Summary: Results Follow-up Letter  [facility]  [address] [place], [place] [address]   Phone: [phone]  Fax: [phone]    03/18/2010  [address] [place], [place]  [address]  [place]  Dear Ms. Haverstick,   The following are the results of your recent test(s):  Your vitamin D level is low but all other labs look normal. I have included a copy for you to review.  _________________________________________________________  Please call for an appointment as needed _________________________________________________________ _________________________________________________________ _________________________________________________________  Sincerely,  Sanda Linger MD [facility]

## 2010-03-27 NOTE — Progress Notes (Signed)
Summary: PAIN MED PROBLEM  Phone Note Call from Patient Call back at Home Phone [phone]   Summary of Call: Tramadol is causing nausea & upset stomach. She is req alt rx for pain .  Initial call taken by: Lamar Sprinkles, CMA,  March 19, 2010 10:11 AM  Follow-up for Phone Call        Pt informed, she tried celebrex years ago and it also caused stomach upset. Tramadol caused severe nausea. She says cymbalta has helped until now.   1.Should she increase dose to try to help w/fibromyalgia pain?  2. What can pt take for pain?  Follow-up by: Lamar Sprinkles, CMA,  March 19, 2010 5:36 PM  Additional Follow-up for Phone Call Additional follow up Details #1::        left mess to call office back................Marland KitchenLamar Sprinkles, CMA  March 20, 2010 3:45 PM     Additional Follow-up for Phone Call Additional follow up Details #2::    Pt informed  Follow-up by: Lamar Sprinkles, CMA,  March 20, 2010 4:07 PM  New/Updated Medications: CELEBREX 200 MG CAPS (CELECOXIB) One by mouth once daily as needed for pain CYMBALTA 60 MG CPEP (DULOXETINE HCL) One by mouth once daily VICODIN 5-500 MG TABS (HYDROCODONE-ACETAMINOPHEN) One by mouth three times a day as needed for pain Prescriptions: VICODIN 5-500 MG TABS (HYDROCODONE-ACETAMINOPHEN) One by mouth three times a day as needed for pain  #50 x 2   Entered by:   Lamar Sprinkles, CMA   Authorized by:   Etta Grandchild MD   Signed by:   Lamar Sprinkles, CMA on 03/20/2010   Method used:   Telephoned to ...       [facility]  [place] [address] (retail)       [address]       [place], [place]  [address]       Ph: [phone] or [phone]       Fax: [phone]   RxID:   [id number] VICODIN 5-500 MG TABS (HYDROCODONE-ACETAMINOPHEN) One by mouth three times a day as needed for pain  #50 x 2   Entered and Authorized by:   Etta Grandchild MD   Signed by:   Etta Grandchild MD on 03/20/2010   Method used:   Historical   RxID:    [id number] CYMBALTA 60 MG CPEP (DULOXETINE HCL) One by mouth once daily  #30 x 11   Entered and Authorized by:   Etta Grandchild MD   Signed by:   Etta Grandchild MD on 03/20/2010   Method used:   [facility]        [facility]  [place] [address] ([facility]ail[facility]       [address]       [place], [place]  [address]       Ph: [phone] or [phone]       Fax: [phone]   RxID:   [id number] CELEBREX 200 MG CAPS (CELECOXIB) One by mouth once daily as needed for pain  #30 x 11   Entered and Authorized by:   Etta Grandchild MD   Signed by:   Etta Grandchild MD on 03/19/2010   Method used:   [facility]        [facility]  [place] [address] [facility]ail[facility]       [address]       [place], [place]  [address]  Ph: [phone] or [phone]       Fax: [phone]   RxID:   [id number]

## 2010-06-24 NOTE — Op Note (Signed)
NAMETAM, SAVOIA            ACCOUNT NO.:  [id number]   MEDICAL RECORD NO.:  [id number]          PATIENT TYPE:  AMB   LOCATION:  [facility]                         FACILITY:  [facility]   PHYSICIAN:  Excell Seltzer. Annabell Howells, M.D.    DATE OF BIRTH:  [birth year]   DATE OF PROCEDURE:  03/15/2007  DATE OF DISCHARGE:                               OPERATIVE REPORT   PROCEDURE:  Cystoscopy, hydrodistention of the bladder, bladder biopsy,  urethral calibration, installation of Pyridium and Marcaine.   PREOPERATIVE DIAGNOSIS:  Painful bladder with bladder wall lesions, need  to rule out chronic inflammatory changes versus carcinoma in situ.   POSTOPERATIVE DIAGNOSIS:  Chronic cystitis.   SURGEON:  Excell Seltzer. Annabell Howells, M.D.   ANESTHESIA:  General.   SPECIMEN:  Bladder biopsies from the posterior wall and left trigone.   COMPLICATIONS:  None.   INDICATIONS FOR PROCEDURE:  Ms. Shore is a 65 year old white female  who had a sling five years ago and has had intermittent problems with  bladder pain and recurrent urinary tract infections.  She also has  persistent incontinence.  She had cystoscopy in the office after  urodynamics last week and was found to have patchy erythema that is  worrisome for carcinoma in situ versus inflammatory changes.  With her  bladder pain, it was felt that hydrodistention and bladder biopsy was  indicated.  She has been on nitrofurantoin suppression for her urinary  infection.   FINDINGS AND PROCEDURE:  She was taken to the operating room.  She  received Cipro and ampicillin. A general anesthetic was induced.  She  was placed in the lithotomy position.  Her perineum and genitalia were  prepped with Betadine solution.  She was draped in the usual sterile  fashion.  The 59 French scope was passed without difficulty per urethra.  Examination revealed a normal urethra without any kinking or significant  angulation, although it was a bit fixed when I attempted to deliver the  scope  down. The bladder had mild trabeculation.  The wall was less  abnormal than it had been on office cystoscopy.  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  The ureteral orifices were  unremarkable.   After thorough cystoscopic inspection with the 12 and 70 degrees lenses,  hydrodistention of the bladder was performed through the cystoscope to  80 cmH2O pressure.  The bladder was filled to capacity and then drained.  Her capacity under anesthesia was 1000 mL.  Inspection of the bladder  wall following hydrodistention revealed minimal glomerulations and was  not consistent with a diagnosis of interstitial cystitis.  Once the  hydrodistention had been completed, bladder biopsies were obtained from  the posterior wall and the left trigone and areas of bladder wall  abnormality.  The biopsy sites were fulgurated with a Bugbee electrode.  The urethra was calibrated and easily accepted a 30-French female sound  although there was some fixation of the urethra as noted on initial  cystoscopy.  The bladder was then instilled with 30 mL of 0.25% Marcaine with 400 mg  of crushed Pyridium.  A B&O suppository was placed.  The patient was  taken down from the lithotomy position, her anesthetic was reversed, and  she was moved to the recovery room in stable condition.  There were no  complications.      Excell Seltzer. Annabell Howells, M.D.  Electronically Signed     JJW/MEDQ  D:  03/15/2007  T:  03/15/2007  Job:  [id number]   cc:   Erasmo Leventhal, M.D.  Fax: [phone]   Ursula Beath, MD  Fax: [phone]

## 2010-06-26 ENCOUNTER — Other Ambulatory Visit: Payer: Self-pay | Admitting: Internal Medicine

## 2010-06-26 NOTE — Telephone Encounter (Signed)
Rx Done . 

## 2010-07-03 ENCOUNTER — Telehealth: Payer: Self-pay | Admitting: *Deleted

## 2010-07-03 MED ORDER — METFORMIN HCL ER 750 MG PO TB24: 750.0000 mg | ORAL_TABLET | Freq: Two times a day (BID) | ORAL | Status: DC

## 2010-07-03 NOTE — Telephone Encounter (Signed)
Rx Done . 

## 2010-10-22 ENCOUNTER — Other Ambulatory Visit: Payer: Self-pay | Admitting: Internal Medicine

## 2010-10-22 DIAGNOSIS — Z1231 Encounter for screening mammogram for malignant neoplasm of breast: Secondary | ICD-10-CM

## 2010-10-27 ENCOUNTER — Ambulatory Visit
Admission: RE | Admit: 2010-10-27 | Discharge: 2010-10-27 | Disposition: A | Discharge status: Home / Self Care | Payer: Medicare Other | Source: Ambulatory Visit | Attending: Internal Medicine | Admitting: Internal Medicine

## 2010-10-27 DIAGNOSIS — Z1231 Encounter for screening mammogram for malignant neoplasm of breast: Secondary | ICD-10-CM

## 2010-10-31 LAB — POCT HEMOGLOBIN-HEMACUE
Hemoglobin: 12.6
Operator id: 268271

## 2010-12-08 ENCOUNTER — Encounter: Payer: Self-pay | Admitting: Internal Medicine

## 2010-12-08 ENCOUNTER — Ambulatory Visit (INDEPENDENT_AMBULATORY_CARE_PROVIDER_SITE_OTHER): Payer: Medicare Other | Admitting: Internal Medicine

## 2010-12-08 VITALS — BP 116/72 | HR 80 | Temp 97.9°F | Resp 14

## 2010-12-08 DIAGNOSIS — Z96659 Presence of unspecified artificial knee joint: Secondary | ICD-10-CM

## 2010-12-08 DIAGNOSIS — Z4802 Encounter for removal of sutures: Secondary | ICD-10-CM

## 2010-12-08 DIAGNOSIS — M1990 Unspecified osteoarthritis, unspecified site: Secondary | ICD-10-CM

## 2010-12-09 ENCOUNTER — Encounter: Payer: Self-pay | Admitting: Internal Medicine

## 2010-12-09 NOTE — Progress Notes (Signed)
  Subjective:    Patient ID: Kristin Garcia, female    DOB: Jun 29, 1945, 65 y.o.   MRN: [id number]  HPI She returns and asks that sutures be removed from her right knee, she had a TKR done 2 weeks ago at [facility], she tells me that she is doing well. The surg sight has not been red, warm, swollen, or shown any drainage.   Review of Systems  Constitutional: Negative for fever, chills, diaphoresis and fatigue.  HENT: Negative.   Respiratory: Negative for cough, chest tightness, shortness of breath, wheezing and stridor.   Cardiovascular: Negative for chest pain, palpitations and leg swelling.  Gastrointestinal: Negative for nausea, vomiting, abdominal pain, diarrhea and constipation.  Musculoskeletal: Positive for arthralgias (right knee). Negative for myalgias, back pain, joint swelling and gait problem.  Skin: Positive for wound. Negative for color change, pallor and rash.  Neurological: Negative.   Hematological: Negative for adenopathy. Does not bruise/bleed easily.  Psychiatric/Behavioral: Negative.        Objective:   Physical Exam  Musculoskeletal:       Legs:      Right knee shoes a vertical wound with 25 simple interrupted sutures in place, I removed all 25 without any complications, there was no exudate/warmth/streaking/fluctuance          Assessment & Plan:

## 2010-12-09 NOTE — Assessment & Plan Note (Signed)
She is s/p TKR and is doing well, sutures were removed

## 2011-01-30 ENCOUNTER — Encounter: Payer: Self-pay | Admitting: Internal Medicine

## 2011-01-30 ENCOUNTER — Telehealth: Payer: Self-pay | Admitting: *Deleted

## 2011-01-30 ENCOUNTER — Ambulatory Visit (INDEPENDENT_AMBULATORY_CARE_PROVIDER_SITE_OTHER): Payer: Medicare Other | Admitting: Internal Medicine

## 2011-01-30 VITALS — BP 120/70 | HR 88 | Temp 98.3°F | Resp 16

## 2011-01-30 DIAGNOSIS — J209 Acute bronchitis, unspecified: Secondary | ICD-10-CM

## 2011-01-30 MED ORDER — AZITHROMYCIN 500 MG PO TABS: 500.0000 mg | ORAL_TABLET | Freq: Every day | ORAL | Status: AC

## 2011-01-30 MED ORDER — GUAIFENESIN-CODEINE 100-10 MG/5ML PO SYRP: 5.0000 mL | ORAL_SOLUTION | Freq: Three times a day (TID) | ORAL | Status: AC | PRN

## 2011-01-30 NOTE — Assessment & Plan Note (Signed)
Start zpak for the infection and a cough suppressant 

## 2011-01-30 NOTE — Progress Notes (Signed)
Subjective:    Patient ID: Kristin Garcia, female    DOB: 16-May-1945, 65 y.o.   MRN: [id number]  Sore Throat  This is a new problem. The current episode started in the past 7 days. The problem has been unchanged. Neither side of throat is experiencing more pain than the other. There has been no fever. Associated symptoms include coughing. Pertinent negatives include no abdominal pain, congestion, diarrhea, drooling, ear discharge, ear pain, headaches, hoarse voice, plugged ear sensation, neck pain, shortness of breath, stridor, swollen glands, trouble swallowing or vomiting.  Cough This is a new problem. The current episode started in the past 7 days. The problem has been gradually worsening. The problem occurs every few hours. The cough is productive of purulent sputum. Associated symptoms include chills, a sore throat and sweats. Pertinent negatives include no chest pain, ear congestion, ear pain, fever, headaches, hemoptysis, myalgias, nasal congestion, postnasal drip, rash, rhinorrhea, shortness of breath, weight loss or wheezing. She has tried OTC cough suppressant for the symptoms. The treatment provided no relief.      Review of Systems  Constitutional: Positive for chills. Negative for fever, weight loss, diaphoresis, activity change, appetite change, fatigue and unexpected weight change.  HENT: Positive for sore throat. Negative for ear pain, nosebleeds, congestion, hoarse voice, rhinorrhea, sneezing, drooling, trouble swallowing, neck pain, voice change, postnasal drip, sinus pressure and ear discharge.   Eyes: Negative.   Respiratory: Positive for cough. Negative for hemoptysis, shortness of breath, wheezing and stridor.   Cardiovascular: Negative for chest pain, palpitations and leg swelling.  Gastrointestinal: Negative for vomiting, abdominal pain, diarrhea and constipation.  Genitourinary: Negative.   Musculoskeletal: Negative for myalgias, back pain, joint swelling, arthralgias  and gait problem.  Skin: Negative for color change, pallor, rash and wound.  Neurological: Negative for dizziness, tremors, seizures, syncope, facial asymmetry, speech difficulty, weakness, light-headedness, numbness and headaches.  Hematological: Negative for adenopathy. Does not bruise/bleed easily.  Psychiatric/Behavioral: Negative.        Objective:   Physical Exam  Vitals reviewed. Constitutional: She is oriented to person, place, and time. She appears well-developed and well-nourished. No distress.  HENT:  Head: Normocephalic and atraumatic. No trismus in the jaw.  Right Ear: Hearing, tympanic membrane, external ear and ear canal normal.  Left Ear: Hearing, tympanic membrane, external ear and ear canal normal.  Nose: Nose normal. No mucosal edema, rhinorrhea, nose lacerations, sinus tenderness, nasal deformity, septal deviation or nasal septal hematoma. No epistaxis.  No foreign bodies. Right sinus exhibits no maxillary sinus tenderness and no frontal sinus tenderness. Left sinus exhibits no maxillary sinus tenderness and no frontal sinus tenderness.  Mouth/Throat: Oropharynx is clear and moist and mucous membranes are normal. Mucous membranes are not pale, not dry and not cyanotic. No uvula swelling. No oropharyngeal exudate, posterior oropharyngeal edema, posterior oropharyngeal erythema or tonsillar abscesses.  Eyes: Conjunctivae are normal. Right eye exhibits no discharge. Left eye exhibits no discharge. No scleral icterus.  Neck: Normal range of motion. Neck supple. No JVD present. No tracheal deviation present. No thyromegaly present.  Cardiovascular: Normal rate, regular rhythm, normal heart sounds and intact distal pulses.  Exam reveals no gallop and no friction rub.   No murmur heard. Pulmonary/Chest: Effort normal and breath sounds normal. No stridor. No respiratory distress. She has no wheezes. She has no rales. She exhibits no tenderness.  Abdominal: Soft. Bowel sounds are  normal. She exhibits no distension. There is no tenderness. There is no rebound and no guarding.  Musculoskeletal: Normal range of motion. She exhibits no edema.  Lymphadenopathy:    She has no cervical adenopathy.  Neurological: She is oriented to person, place, and time.  Skin: Skin is warm and dry. No rash noted. She is not diaphoretic. No erythema. No pallor.  Psychiatric: She has a normal mood and affect. Her behavior is normal. Judgment and thought content normal.          Assessment & Plan:

## 2011-01-30 NOTE — Telephone Encounter (Signed)
Pt states that she has a sore throat, congestion and cough (no fever). Pt is asking MD to call in a rx for her to her pharmacy.

## 2011-01-30 NOTE — Patient Instructions (Signed)

## 2011-01-30 NOTE — Telephone Encounter (Signed)
Pt informed, transferred to scheduler to schedule an appointment.

## 2011-01-30 NOTE — Telephone Encounter (Signed)
She needs to be seen.

## 2011-02-11 ENCOUNTER — Other Ambulatory Visit: Payer: Self-pay | Admitting: Internal Medicine

## 2011-02-23 ENCOUNTER — Other Ambulatory Visit: Payer: Self-pay | Admitting: Dermatology

## 2011-03-23 ENCOUNTER — Other Ambulatory Visit: Payer: Self-pay | Admitting: Internal Medicine

## 2011-03-24 ENCOUNTER — Encounter: Payer: Self-pay | Admitting: Gastroenterology

## 2011-03-24 ENCOUNTER — Telehealth: Payer: Self-pay | Admitting: *Deleted

## 2011-03-24 NOTE — Telephone Encounter (Signed)
Left msg on vm been nausated past couple days. Had hx of bleeding ulcers. Requesting referral to see GI specialist in [place] [facility].... 03/24/11@3 :32pm/LMB

## 2011-03-24 NOTE — Telephone Encounter (Signed)
I should see her first

## 2011-03-25 ENCOUNTER — Other Ambulatory Visit (INDEPENDENT_AMBULATORY_CARE_PROVIDER_SITE_OTHER): Payer: Medicare Other

## 2011-03-25 ENCOUNTER — Ambulatory Visit (INDEPENDENT_AMBULATORY_CARE_PROVIDER_SITE_OTHER): Payer: Medicare Other | Admitting: Internal Medicine

## 2011-03-25 ENCOUNTER — Encounter: Payer: Self-pay | Admitting: Internal Medicine

## 2011-03-25 DIAGNOSIS — K279 Peptic ulcer, site unspecified, unspecified as acute or chronic, without hemorrhage or perforation: Secondary | ICD-10-CM

## 2011-03-25 DIAGNOSIS — J209 Acute bronchitis, unspecified: Secondary | ICD-10-CM

## 2011-03-25 DIAGNOSIS — R10816 Epigastric abdominal tenderness: Secondary | ICD-10-CM

## 2011-03-25 LAB — COMPREHENSIVE METABOLIC PANEL
ALT: 17 U/L (ref 0–35)
Alkaline Phosphatase: 87 U/L (ref 39–117)
CO2: 29 mEq/L (ref 19–32)
Creatinine, Ser: 0.8 mg/dL (ref 0.4–1.2)
GFR: 71.97 mL/min (ref >60.00)
Sodium: 137 mEq/L (ref 135–145)
Total Bilirubin: 0.6 mg/dL (ref 0.3–1.2)

## 2011-03-25 LAB — URINALYSIS, ROUTINE W REFLEX MICROSCOPIC
Ketones, ur: NEGATIVE
Leukocytes, UA: NEGATIVE
Nitrite: NEGATIVE
Specific Gravity, Urine: <=1.005 (ref 1.000–1.030)
Total Protein, Urine: NEGATIVE
pH: 5.5 (ref 5.0–8.0)

## 2011-03-25 LAB — CBC WITH DIFFERENTIAL/PLATELET
Basophils Absolute: 0.1 10*3/uL (ref 0.0–0.1)
HCT: 41.4 % (ref 36.0–46.0)
Hemoglobin: 13.6 g/dL (ref 12.0–15.0)
Lymphs Abs: 1.9 10*3/uL (ref 0.7–4.0)
MCHC: 32.8 g/dL (ref 30.0–36.0)
MCV: 83 fl (ref 78.0–100.0)
Monocytes Absolute: 1 10*3/uL (ref 0.1–1.0)
Monocytes Relative: 15.8 % — ABNORMAL HIGH (ref 3.0–12.0)
Neutro Abs: 3.3 10*3/uL (ref 1.4–7.7)
Platelets: 317 10*3/uL (ref 150.0–400.0)
RDW: 14.6 % (ref 11.5–14.6)

## 2011-03-25 LAB — LIPASE: Lipase: 39 U/L (ref 11.0–59.0)

## 2011-03-25 LAB — FECAL OCCULT BLOOD, GUAIAC: Fecal Occult Blood: NEGATIVE

## 2011-03-25 MED ORDER — HYOSCYAMINE SULFATE ER 0.375 MG PO TB12: 0.3750 mg | ORAL_TABLET | Freq: Two times a day (BID) | ORAL | Status: DC | PRN

## 2011-03-25 NOTE — Patient Instructions (Signed)
Esophagitis Esophagitis (heartburn) is a painful, burning sensation in the chest. It may feel worse in certain positions, such as lying down or bending over. It is caused by stomach acid backing up into the tube that carries food from the mouth down to the stomach (lower esophagus). TREATMENT  There are a number of non-prescription medicines used to treat heartburn, including:  Antacids.   Acid reducers (also called H-2 blockers).   Proton-pump inhibitors.  HOME CARE INSTRUCTIONS   Raise the head of your bead by putting blocks under the legs.   Eat 2-3 hours before going to bed.   Stop smoking.   Try to reach and maintain a healthy weight.   Do not eat just a few very large meals. Instead, eat many smaller meals throughout the day.   Try to identify foods and beverages that make your symptoms worse, and avoid these.   Avoid tight clothing.   Do not exercise right after eating.  SEEK IMMEDIATE MEDICAL CARE IF:  You have severe chest pain that goes down your arm, or into your jaw or neck.   You feel sweaty, dizzy, or lightheaded.   You are short of breath.   You throw up (vomit) blood.   You have difficulty or pain with swallowing.   You have bloody or black, tarry stools.   You have bouts of heartburn more than three times a week for more than two weeks.  Document Released: 03/05/2004 Document Revised: 08/11/2010 Document Reviewed: 06/06/2008 [employer] Patient Information 2012 [employer]. 

## 2011-03-25 NOTE — Assessment & Plan Note (Addendum)
Stop celebrex for now, continue prevacid BID and add on levbid for symptom relief and additional acid suppression, I will check labs today to look for complications, blood loss, she will report any new or worsening symptoms to me

## 2011-03-25 NOTE — Telephone Encounter (Signed)
Pt advised and transferred to sch

## 2011-03-25 NOTE — Assessment & Plan Note (Signed)
I will check her labs today to look for organic causes, in the meantime I will treat her GERD and PUD and she will see GI asap, she has made the call

## 2011-03-25 NOTE — Progress Notes (Signed)
Subjective:    Patient ID: Kristin Garcia, female    DOB: [birth year], 66 y.o.   MRN: [id number]  Abdominal Pain This is a new problem. The current episode started in the past 7 days. The onset quality is gradual. The problem occurs intermittently. The most recent episode lasted 5 days. The problem has been unchanged. The pain is located in the epigastric region. The pain is at a severity of 1/10. The pain is mild. The quality of the pain is aching. The abdominal pain does not radiate. Associated symptoms include anorexia, belching, constipation and nausea. Pertinent negatives include no arthralgias, diarrhea, dysuria, fever, flatus, frequency, headaches, hematochezia, hematuria, melena, myalgias, vomiting or weight loss. The pain is aggravated by eating. The pain is relieved by nothing. She has tried proton pump inhibitors for the symptoms. The treatment provided no relief. Her past medical history is significant for PUD.      Review of Systems  Constitutional: Negative for fever, chills, weight loss, diaphoresis, activity change, appetite change, fatigue and unexpected weight change.  HENT: Negative.   Eyes: Negative.   Respiratory: Negative for apnea, cough, choking, chest tightness, shortness of breath, wheezing and stridor.   Cardiovascular: Negative for chest pain, palpitations and leg swelling.  Gastrointestinal: Positive for nausea, abdominal pain, constipation and anorexia. Negative for vomiting, diarrhea, blood in stool, melena, hematochezia, abdominal distention, anal bleeding, rectal pain and flatus.  Genitourinary: Negative.  Negative for dysuria, frequency and hematuria.  Musculoskeletal: Negative for myalgias, back pain, joint swelling, arthralgias and gait problem.  Skin: Negative for color change, pallor, rash and wound.  Neurological: Negative for dizziness, tremors, seizures, syncope, facial asymmetry, speech difficulty, weakness, light-headedness, numbness and headaches.   Hematological: Negative for adenopathy. Does not bruise/bleed easily.  Psychiatric/Behavioral: Negative.        Objective:   Physical Exam  Vitals reviewed. Constitutional: She is oriented to person, place, and time. She appears well-developed and well-nourished. No distress.  HENT:  Head: Normocephalic and atraumatic.  Mouth/Throat: Oropharynx is clear and moist. No oropharyngeal exudate.  Eyes: Conjunctivae are normal. Right eye exhibits no discharge. Left eye exhibits no discharge. No scleral icterus.  Neck: Normal range of motion. Neck supple. No JVD present. No tracheal deviation present. No thyromegaly present.  Cardiovascular: Normal rate, regular rhythm, normal heart sounds and intact distal pulses.  Exam reveals no gallop and no friction rub.   No murmur heard. Pulmonary/Chest: Effort normal and breath sounds normal. No stridor. No respiratory distress. She has no wheezes. She has no rales. She exhibits no tenderness.  Abdominal: Soft. Normal appearance and bowel sounds are normal. She exhibits no shifting dullness, no distension, no pulsatile liver, no fluid wave, no abdominal bruit, no ascites, no pulsatile midline mass and no mass. There is no hepatosplenomegaly or splenomegaly. There is tenderness in the epigastric area. There is no rigidity, no rebound, no guarding, no CVA tenderness, no tenderness at McBurney's point and negative Murphy's sign. No hernia. Hernia confirmed negative in the ventral area.  Genitourinary: Rectum normal. Rectal exam shows no external hemorrhoid, no internal hemorrhoid, no fissure, no mass, no tenderness and anal tone normal. Guaiac negative stool.  Musculoskeletal: Normal range of motion. She exhibits no edema and no tenderness.  Lymphadenopathy:    She has no cervical adenopathy.  Neurological: She is oriented to person, place, and time.  Skin: Skin is warm and dry. No rash noted. She is not diaphoretic. No erythema. No pallor.  Psychiatric: She  has a normal mood  and affect. Her behavior is normal. Judgment and thought content normal.     Lab Results  Component Value Date   WBC 7.3 03/17/2010   HGB 13.1 03/17/2010   HCT 37.9 03/17/2010   PLT 277.0 03/17/2010   GLUCOSE 73 03/17/2010   CHOL 280* 07/24/2009   TRIG 105.0 07/24/2009   HDL 99.10 07/24/2009   LDLDIRECT 145.3 07/24/2009   ALT 18 03/17/2010   AST 20 03/17/2010   NA 141 03/17/2010   K 5.1 03/17/2010   CL 104 03/17/2010   CREATININE 0.8 03/17/2010   BUN 19 03/17/2010   CO2 29 03/17/2010   TSH 1.06 03/17/2010       Assessment & Plan:

## 2011-04-06 ENCOUNTER — Other Ambulatory Visit: Payer: Self-pay | Admitting: Internal Medicine

## 2011-04-06 ENCOUNTER — Ambulatory Visit (INDEPENDENT_AMBULATORY_CARE_PROVIDER_SITE_OTHER): Payer: Medicare Other | Admitting: Gastroenterology

## 2011-04-06 ENCOUNTER — Encounter: Payer: Self-pay | Admitting: Gastroenterology

## 2011-04-06 DIAGNOSIS — R1013 Epigastric pain: Secondary | ICD-10-CM

## 2011-04-06 MED ORDER — PEG-KCL-NACL-NASULF-NA ASC-C 100 G PO SOLR: 1.0000 | ORAL | Status: DC

## 2011-04-06 NOTE — Patient Instructions (Addendum)
We will get reports from Dr. Marlyne Beards, [facility] (previous colonoscopy, EGD, path reports). You will be set up for a colonoscopy for FH of colon cancer. You will be set up for an upper endoscopy for abd pains, history of severe bleeding ulcers in past.

## 2011-04-06 NOTE — Progress Notes (Signed)
HPI: This is a   very pleasant 66 year old woman whom I am meeting for the first time today.  Epigastric pain, radiated to back.  Lasted 3-4 days, constant during this.  She has had 2 ulcers in past (bleeding, was coded from severe severe bleeding 8 units of blood.  5 years later was found down, 6 units of blood,.  Found duodenal ulcers both times.   She never had pains with these in past.  Was not on too much NSAIDs, was not on blood thinners, no ASA.  (these were 1987, 1993).    Has been on BID PPI since 1993.  Very good about meds  With new pain, she stopped celebrex, started hyosciamine.  Pains improved.    Had colonososcopy in [place] (about 7-8 years ago).  This was normal (no polyps).  Has had other colos in past, never found polyps.  Her maternal GM and dad's twin sister both had colon cancer).  Had open chole in early 80s for ruptured GB.  Review of systems: Pertinent positive and negative review of systems were noted in the above HPI section. Complete review of systems was performed and was otherwise normal.    Past Medical History  Diagnosis Date  . Anemia   . Anxiety   . Depression   . Ulcer     Peptic  . Hyperlipidemia   . Arthritis   . OAB (overactive bladder)   . Urinary incontinence   . Interstitial cystitis   . Fibromyalgia     Past Surgical History  Procedure Date  . Cholecystectomy   . Abdominal hysterectomy   . Tonsillectomy   . Breast lumpectomy     RT  . Bladder suspension     Current Outpatient Prescriptions  Medication Sig Dispense Refill  . DULoxetine (CYMBALTA) 60 MG capsule Take 60 mg by mouth daily.        . hyoscyamine (LEVBID) 0.375 MG 12 hr tablet Take 1 tablet (0.375 mg total) by mouth every 12 (twelve) hours as needed for cramping.  60 tablet  3  . lansoprazole (PREVACID) 30 MG capsule TAKE ONE CAPSULE BY MOUTH TWICE A DAY  180 capsule  2  . nitrofurantoin (MACRODANTIN) 100 MG capsule TAKE ONE CAPSULE BY MOUTH AT BEDTIME  90  capsule  2    Allergies as of 04/06/2011 - Review Complete 04/06/2011  Allergen Reaction Noted  . Iodine      Family History  Problem Relation Age of Onset  . Hypertension Father   . Heart disease Father   . Stomach cancer Paternal Grandmother   . Colon cancer Paternal Aunt   . Colon cancer Maternal Grandmother     History   Social History  . Marital Status: Married    Spouse Name: N/A    Number of Children: 3  . Years of Education: N/A   Occupational History  . Retired-RN    Social History Main Topics  . Smoking status: Never Smoker   . Smokeless tobacco: Never Used   Comment: Regular Exercise - No  . Alcohol Use: 3.5 - 7.0 oz/week    7-14 drink(s) per week  . Drug Use: No  . Sexually Active: Not Currently   Other Topics Concern  . Not on file   Social History Narrative  . No narrative on file       Physical Exam: BP 122/68  Pulse 76  Ht 5\' 6"  (1.676 m)  Wt 165 lb (74.844 kg)  BMI 26.63 kg/m2 Constitutional:  generally well-appearing Psychiatric: alert and oriented x3 Eyes: extraocular movements intact Mouth: oral pharynx moist, no lesions Neck: supple no lymphadenopathy Cardiovascular: heart regular rate and rhythm Lungs: clear to auscultation bilaterally Abdomen: soft, nontender, nondistended, no obvious ascites, no peritoneal signs, normal bowel sounds Extremities: no lower extremity edema bilaterally Skin: no lesions on visible extremities    Assessment and plan: 66 y.o. female with  2 second degree relatives with colon cancer, personal history of severe bleeding duodenal ulcers now with epigastric pain  Her pain actually improved with antispasmodic medicines and some time. I did not mention above that she did have lab tests around the time of her pain. CBC and complete metabolic profile were both normal. She is very nervous about the possibility of recurrent peptic ulcer disease and I can understand why given her severe upper GI bleeds in the  80s, 90s. We will proceed with EGD at her since convenience at the same time we'll perform colonoscopy for somewhat elevated colon cancer risk with her 2 second degree relatives with colon cancer. It has been 7-8 years since her last one.  she knows to continue taking her proton pump inhibitor twice daily and avoid NSAIDs.

## 2011-04-07 ENCOUNTER — Telehealth: Payer: Self-pay | Admitting: Gastroenterology

## 2011-04-07 NOTE — Telephone Encounter (Signed)
Pt was advised to call her surgeon and have them prescribe the medication they want her to have.  She agreed.

## 2011-04-20 ENCOUNTER — Telehealth: Payer: Self-pay | Admitting: Gastroenterology

## 2011-04-20 NOTE — Telephone Encounter (Signed)
Received 74 pages. Sent to Dr. Christella Hartigan.

## 2011-04-28 ENCOUNTER — Telehealth: Payer: Self-pay | Admitting: Gastroenterology

## 2011-04-28 NOTE — Telephone Encounter (Signed)
Received copies from Dr.Adams ,on3/19/13 . Forwarded  61 pages to Dr Christella Hartigan. ,for review.

## 2011-05-05 ENCOUNTER — Telehealth: Payer: Self-pay | Admitting: Gastroenterology

## 2011-05-05 NOTE — Telephone Encounter (Signed)
[facility] This was done for recurrent epigastric pain, past history of ulcer disease.  Found reflux esophagitis, gastritis, fundic gland polyp.  Colonoscopy January 2002 [facility] [place], Dr. Pernell Dupre, done for lower abdominal pain, family history of colon cancer, findings showed minimal to mild sigmoid diverticulosis and tortuosity, otherwise normal examination. No polyps or cancers

## 2011-05-22 ENCOUNTER — Ambulatory Visit (AMBULATORY_SURGERY_CENTER): Payer: Medicare Other | Admitting: Gastroenterology

## 2011-05-22 ENCOUNTER — Encounter: Payer: Self-pay | Admitting: Gastroenterology

## 2011-05-22 VITALS — BP 113/73 | HR 75 | Temp 98.0°F | Resp 16 | Ht 65.0 in | Wt 160.0 lb

## 2011-05-22 DIAGNOSIS — K2960 Other gastritis without bleeding: Secondary | ICD-10-CM

## 2011-05-22 DIAGNOSIS — Z809 Family history of malignant neoplasm, unspecified: Secondary | ICD-10-CM

## 2011-05-22 DIAGNOSIS — R1013 Epigastric pain: Secondary | ICD-10-CM

## 2011-05-22 DIAGNOSIS — K2971 Gastritis, unspecified, with bleeding: Secondary | ICD-10-CM

## 2011-05-22 MED ORDER — SODIUM CHLORIDE 0.9 % IV SOLN: 500.0000 mL | INTRAVENOUS | Status: DC

## 2011-05-22 NOTE — Patient Instructions (Signed)

## 2011-05-22 NOTE — Progress Notes (Signed)
Patient did not experience any of the following events: a burn prior to discharge; a fall within the facility; wrong site/side/patient/procedure/implant event; or a hospital transfer or hospital admission upon discharge from the facility. (G8907) Patient did not have preoperative order for IV antibiotic SSI prophylaxis. (G8918)  

## 2011-05-22 NOTE — Op Note (Signed)
[facility] [address] [place], [place]  [address]  ENDOSCOPY PROCEDURE REPORT  PATIENT:  Kristin Garcia, Kristin Garcia  MR#:  [id number] BIRTHDATE:  1946/02/06, 65 yrs. 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  The LB [id number] endoscope was introduced through the mouth and advanced to the second portion of the duodenum, without limitations.  The instrument was slowly withdrawn as the mucosa was fully examined. <<PROCEDUREIMAGES>> This was mild, non-specific.  Biopsies taken and sent to pathology (jar 1) (see image4).  Otherwise the examination was normal (see image1, image2, image3, and image5).    Retroflexed views revealed no abnormalities.    The scope was then withdrawn from the patient and the procedure completed. COMPLICATIONS:  None  ENDOSCOPIC IMPRESSION: 1) Mild gastritis, biopsied to check for H. pylori 2) Otherwise normal examination  RECOMMENDATIONS: 1) Await pathology results  ______________________________ Rachael Fee, MD  n. eSIGNED:   Rachael Fee at 05/22/2011 02:00 PM  Reola Calkins, [phone]

## 2011-05-22 NOTE — Op Note (Signed)
[facility] [address] [place], [place]  [address]  COLONOSCOPY PROCEDURE REPORT  PATIENT:  Kristin Garcia, Kristin Garcia  MR#:  [id number] BIRTHDATE:  1945-08-27, 65 yrs. old  GENDER:  female ENDOSCOPIST:  Rachael Fee, MD REF. BY:  Etta Grandchild, M.D. PROCEDURE DATE:  05/22/2011 PROCEDURE:  Colonoscopy 19147 ASA CLASS:  Class II INDICATIONS:  Routine Risk Screening colonsocopy >10 years ago, elsewhere MEDICATIONS:   Fentanyl 100 mcg IV, These medications were titrated to patient response per physician's verbal order, Versed 8 mg IV  DESCRIPTION OF PROCEDURE:   After the risks benefits and alternatives of the procedure were thoroughly explained, informed consent was obtained.  Digital rectal exam was performed and revealed no rectal masses.   The LB 180AL [id number] endoscope was introduced through the anus and advanced to the cecum, which was identified by both the appendix and ileocecal valve, limited by poor preparation.    The quality of the prep was poor..  The instrument was then slowly withdrawn as the colon was fully examined.<<PROCEDUREIMAGES>> FINDINGS:  There was a large amount of thick brown liquid stool throughout colon that impeded adequate view of the mucosa. No obstructing cancers seen, but small lesions could have been missed (see image3, image6, image7, image2, and image1).   Retroflexed views in the rectum revealed no abnormalities. COMPLICATIONS:  None  ENDOSCOPIC IMPRESSION: 1) Poor prep (see above)  RECOMMENDATIONS: Dr. Christella Hartigan' office will contact you to set up repeat colonoscopy with "double prep."  ______________________________ Rachael Fee, MD  n. eSIGNED:   Rachael Fee at 05/22/2011 01:51 PM  Reola Calkins, [phone]

## 2011-05-25 ENCOUNTER — Telehealth: Payer: Self-pay | Admitting: *Deleted

## 2011-05-25 NOTE — Telephone Encounter (Signed)
  Follow up Call-  Call back number 05/22/2011  Post procedure Call Back phone  # [phone]  Permission to leave phone message Yes     Patient questions:  Do you have a fever, pain , or abdominal swelling? no Pain Score  0 *  Have you tolerated food without any problems? yes  Have you been able to return to your normal activities? yes  Do you have any questions about your discharge instructions: Diet   no Medications  no Follow up visit  no  Do you have questions or concerns about your Care? no  Actions: * If pain score is 4 or above: No action needed, pain <4.

## 2011-05-28 ENCOUNTER — Encounter: Payer: Self-pay | Admitting: Gastroenterology

## 2011-06-03 ENCOUNTER — Telehealth: Payer: Self-pay | Admitting: Gastroenterology

## 2011-06-03 NOTE — Telephone Encounter (Signed)
Pt notified that the path is not available and she will be called when Dr Christella Hartigan reviewed

## 2011-08-24 ENCOUNTER — Ambulatory Visit (INDEPENDENT_AMBULATORY_CARE_PROVIDER_SITE_OTHER)
Admission: RE | Admit: 2011-08-24 | Discharge: 2011-08-24 | Disposition: A | Discharge status: Home / Self Care | Payer: Medicare Other | Source: Ambulatory Visit | Attending: Internal Medicine | Admitting: Internal Medicine

## 2011-08-24 ENCOUNTER — Ambulatory Visit (INDEPENDENT_AMBULATORY_CARE_PROVIDER_SITE_OTHER): Payer: Medicare Other | Admitting: Internal Medicine

## 2011-08-24 ENCOUNTER — Encounter: Payer: Self-pay | Admitting: Internal Medicine

## 2011-08-24 VITALS — BP 132/78 | HR 74 | Temp 98.3°F | Resp 16

## 2011-08-24 DIAGNOSIS — R05 Cough: Secondary | ICD-10-CM

## 2011-08-24 DIAGNOSIS — G4700 Insomnia, unspecified: Secondary | ICD-10-CM

## 2011-08-24 DIAGNOSIS — J209 Acute bronchitis, unspecified: Secondary | ICD-10-CM

## 2011-08-24 MED ORDER — HYDROCODONE-HOMATROPINE 5-1.5 MG/5ML PO SYRP: 5.0000 mL | ORAL_SOLUTION | Freq: Three times a day (TID) | ORAL | Status: AC | PRN

## 2011-08-24 MED ORDER — ZOLPIDEM TARTRATE 1.75 MG SL SUBL: 1.0000 | SUBLINGUAL_TABLET | Freq: Every day | SUBLINGUAL | Status: DC

## 2011-08-24 MED ORDER — CEFUROXIME AXETIL 500 MG PO TABS: 500.0000 mg | ORAL_TABLET | Freq: Two times a day (BID) | ORAL | Status: AC

## 2011-08-24 NOTE — Progress Notes (Signed)
Subjective:    Patient ID: Kristin Garcia, female    DOB: 03/02/1945, 66 y.o.   MRN: [id number]  Cough This is a recurrent problem. The current episode started more than 1 month ago. The problem has been unchanged. The cough is productive of purulent sputum. Associated symptoms include chills and a sore throat. Pertinent negatives include no chest pain, ear congestion, ear pain, fever, heartburn, hemoptysis, myalgias, nasal congestion, postnasal drip, rash, rhinorrhea, shortness of breath, sweats, weight loss or wheezing. She has tried nothing for the symptoms. The treatment provided no relief.      Review of Systems  Constitutional: Positive for chills. Negative for fever, weight loss, diaphoresis, activity change, appetite change, fatigue and unexpected weight change.  HENT: Positive for sore throat and sinus pressure. Negative for hearing loss, ear pain, nosebleeds, congestion, facial swelling, rhinorrhea, sneezing, drooling, mouth sores, trouble swallowing, neck pain, neck stiffness, dental problem, voice change, postnasal drip, tinnitus and ear discharge.   Eyes: Negative.   Respiratory: Positive for cough. Negative for apnea, hemoptysis, choking, chest tightness, shortness of breath, wheezing and stridor.   Cardiovascular: Negative for chest pain, palpitations and leg swelling.  Gastrointestinal: Negative.  Negative for heartburn.  Genitourinary: Negative.   Musculoskeletal: Negative.  Negative for myalgias.  Skin: Negative for color change, pallor, rash and wound.  Neurological: Negative.   Hematological: Negative for adenopathy. Does not bruise/bleed easily.  Psychiatric/Behavioral: Positive for disturbed wake/sleep cycle (frequent awakenings and ema). Negative for suicidal ideas, hallucinations, behavioral problems, confusion, self-injury, dysphoric mood, decreased concentration and agitation. The patient is not nervous/anxious and is not hyperactive.        Objective:   Physical Exam  Vitals reviewed. Constitutional: She is oriented to person, place, and time. She appears well-developed and well-nourished.  Non-toxic appearance. She does not have a sickly appearance. She does not appear ill. No distress.  HENT:  Head: Normocephalic and atraumatic. No trismus in the jaw.  Right Ear: Hearing, tympanic membrane, external ear and ear canal normal.  Left Ear: Hearing, tympanic membrane, external ear and ear canal normal.  Nose: Nose normal. No mucosal edema, rhinorrhea, nose lacerations, sinus tenderness, nasal deformity, septal deviation or nasal septal hematoma. No epistaxis.  No foreign bodies. Right sinus exhibits no maxillary sinus tenderness and no frontal sinus tenderness. Left sinus exhibits no maxillary sinus tenderness and no frontal sinus tenderness.  Mouth/Throat: Oropharynx is clear and moist and mucous membranes are normal. Mucous membranes are not pale and not cyanotic. No oral lesions. No uvula swelling. No oropharyngeal exudate, posterior oropharyngeal edema, posterior oropharyngeal erythema or tonsillar abscesses.  Eyes: Conjunctivae are normal. Right eye exhibits no discharge. Left eye exhibits no discharge. No scleral icterus.  Neck: Normal range of motion. Neck supple. No JVD present. No tracheal deviation present. No thyromegaly present.  Cardiovascular: Normal rate, regular rhythm, normal heart sounds and intact distal pulses.  Exam reveals no gallop and no friction rub.   No murmur heard. Pulmonary/Chest: Effort normal and breath sounds normal. No stridor. No respiratory distress. She has no wheezes. She has no rales. She exhibits no tenderness.  Abdominal: Soft. Bowel sounds are normal. She exhibits no distension and no mass. There is no tenderness. There is no rebound and no guarding.  Musculoskeletal: Normal range of motion. She exhibits no edema and no tenderness.  Lymphadenopathy:    She has no cervical adenopathy.  Neurological: She is  oriented to person, place, and time.  Skin: Skin is warm and dry. No rash  noted. She is not diaphoretic. No erythema. No pallor.  Psychiatric: She has a normal mood and affect. Her behavior is normal. Judgment and thought content normal.     Lab Results  Component Value Date   WBC 6.5 03/25/2011   HGB 13.6 03/25/2011   HCT 41.4 03/25/2011   PLT 317.0 03/25/2011   GLUCOSE 82 03/25/2011   CHOL 280* 07/24/2009   TRIG 105.0 07/24/2009   HDL 99.10 07/24/2009   LDLDIRECT 145.3 07/24/2009   ALT 17 03/25/2011   AST 19 03/25/2011   NA 137 03/25/2011   K 4.6 03/25/2011   CL 101 03/25/2011   CREATININE 0.8 03/25/2011   BUN 13 03/25/2011   CO2 29 03/25/2011   TSH 1.06 03/17/2010       Assessment & Plan:

## 2011-08-24 NOTE — Assessment & Plan Note (Signed)
Start ceftin for the infection and a cough suppressant 

## 2011-08-24 NOTE — Assessment & Plan Note (Signed)
She will try intermezzo

## 2011-08-24 NOTE — Assessment & Plan Note (Signed)
I will check a CXR on her to look for pna, mass, edema

## 2011-08-24 NOTE — Patient Instructions (Signed)
Acute Bronchitis You have acute bronchitis. This means you have a chest cold. The airways in your lungs are red and sore (inflamed). Acute means it is sudden onset.  CAUSES Bronchitis is most often caused by the same virus that causes a cold. SYMPTOMS   Body aches.   Chest congestion.   Chills.   Cough.   Fever.   Shortness of breath.   Sore throat.  TREATMENT  Acute bronchitis is usually treated with rest, fluids, and medicines for relief of fever or cough. Most symptoms should go away after a few days or a week. Increased fluids may help thin your secretions and will prevent dehydration. Your caregiver may give you an inhaler to improve your symptoms. The inhaler reduces shortness of breath and helps control cough. You can take over-the-counter pain relievers or cough medicine to decrease coughing, pain, or fever. A cool-air vaporizer may help thin bronchial secretions and make it easier to clear your chest. Antibiotics are usually not needed but can be prescribed if you smoke, are seriously ill, have chronic lung problems, are elderly, or you are at higher risk for developing complications.Allergies and asthma can make bronchitis worse. Repeated episodes of bronchitis may cause longstanding lung problems. Avoid smoking and secondhand smoke.Exposure to cigarette smoke or irritating chemicals will make bronchitis worse. If you are a cigarette smoker, consider using nicotine gum or skin patches to help control withdrawal symptoms. Quitting smoking will help your lungs heal faster. Recovery from bronchitis is often slow, but you should start feeling better after 2 to 3 days. Cough from bronchitis frequently lasts for 3 to 4 weeks. To prevent another bout of acute bronchitis:  Quit smoking.   Wash your hands frequently to get rid of viruses or use a hand sanitizer.   Avoid other people with cold or virus symptoms.   Try not to touch your hands to your mouth, nose, or eyes.  SEEK  IMMEDIATE MEDICAL CARE IF:  You develop increased fever, chills, or chest pain.   You have severe shortness of breath or bloody sputum.   You develop dehydration, fainting, repeated vomiting, or a severe headache.   You have no improvement after 1 week of treatment or you get worse.  MAKE SURE YOU:   Understand these instructions.   Will watch your condition.   Will get help right away if you are not doing well or get worse.  Document Released: 03/05/2004 Document Revised: 01/15/2011 Document Reviewed: 05/21/2010 [facility] Patient Information 2012 [place], [place].Insomnia Insomnia is frequent trouble falling and/or staying asleep. Insomnia can be a long term problem or a short term problem. Both are common. Insomnia can be a short term problem when the wakefulness is related to a certain stress or worry. Long term insomnia is often related to ongoing stress during waking hours and/or poor sleeping habits. Overtime, sleep deprivation itself can make the problem worse. Every little thing feels more severe because you are overtired and your ability to cope is decreased. CAUSES   Stress, anxiety, and depression.   Poor sleeping habits.   Distractions such as TV in the bedroom.   Naps close to bedtime.   Engaging in emotionally charged conversations before bed.   Technical reading before sleep.   Alcohol and other sedatives. They may make the problem worse. They can hurt normal sleep patterns and normal dream activity.   Stimulants such as caffeine for several hours prior to bedtime.   Pain syndromes and shortness of breath can cause insomnia.  Exercise late at night.   Changing time zones may cause sleeping problems (jet lag).  It is sometimes helpful to have someone observe your sleeping patterns. They should look for periods of not breathing during the night (sleep apnea). They should also look to see how long those periods last. If you live alone or observers are uncertain,  you can also be observed at a sleep clinic where your sleep patterns will be professionally monitored. Sleep apnea requires a checkup and treatment. Give your caregivers your medical history. Give your caregivers observations your family has made about your sleep.  SYMPTOMS   Not feeling rested in the morning.   Anxiety and restlessness at bedtime.   Difficulty falling and staying asleep.  TREATMENT   Your caregiver may prescribe treatment for an underlying medical disorders. Your caregiver can give advice or help if you are using alcohol or other drugs for self-medication. Treatment of underlying problems will usually eliminate insomnia problems.   Medications can be prescribed for short time use. They are generally not recommended for lengthy use.   Over-the-counter sleep medicines are not recommended for lengthy use. They can be habit forming.   You can promote easier sleeping by making lifestyle changes such as:   Using relaxation techniques that help with breathing and reduce muscle tension.   Exercising earlier in the day.   Changing your diet and the time of your last meal. No night time snacks.   Establish a regular time to go to bed.   Counseling can help with stressful problems and worry.   Soothing music and white noise may be helpful if there are background noises you cannot remove.   Stop tedious detailed work at least one hour before bedtime.  HOME CARE INSTRUCTIONS   Keep a diary. Inform your caregiver about your progress. This includes any medication side effects. See your caregiver regularly. Take note of:   Times when you are asleep.   Times when you are awake during the night.   The quality of your sleep.   How you feel the next day.  This information will help your caregiver care for you.  Get out of bed if you are still awake after 15 minutes. Read or do some quiet activity. Keep the lights down. Wait until you feel sleepy and go back to bed.   Keep  regular sleeping and waking hours. Avoid naps.   Exercise regularly.   Avoid distractions at bedtime. Distractions include watching television or engaging in any intense or detailed activity like attempting to balance the household checkbook.   Develop a bedtime ritual. Keep a familiar routine of bathing, brushing your teeth, climbing into bed at the same time each night, listening to soothing music. Routines increase the success of falling to sleep faster.   Use relaxation techniques. This can be using breathing and muscle tension release routines. It can also include visualizing peaceful scenes. You can also help control troubling or intruding thoughts by keeping your mind occupied with boring or repetitive thoughts like the old concept of counting sheep. You can make it more creative like imagining planting one beautiful flower after another in your backyard garden.   During your day, work to eliminate stress. When this is not possible use some of the previous suggestions to help reduce the anxiety that accompanies stressful situations.  MAKE SURE YOU:   Understand these instructions.   Will watch your condition.   Will get help right away if you are not doing  well or get worse.  Document Released: 01/24/2000 Document Revised: 01/15/2011 Document Reviewed: 02/23/2007 [facility] Patient Information 2012 [place], [place].

## 2011-10-09 ENCOUNTER — Other Ambulatory Visit: Payer: Self-pay

## 2011-10-09 MED ORDER — DULOXETINE HCL 60 MG PO CPEP: ORAL_CAPSULE | ORAL | Status: DC

## 2011-10-09 NOTE — Telephone Encounter (Signed)
Received walk in sheet for patient stating that rx plan has changed and cymbalta has to be filled through mail order. RX faxed to [facility]

## 2011-10-14 ENCOUNTER — Other Ambulatory Visit: Payer: Self-pay | Admitting: Internal Medicine

## 2011-10-14 DIAGNOSIS — Z1231 Encounter for screening mammogram for malignant neoplasm of breast: Secondary | ICD-10-CM

## 2011-11-03 ENCOUNTER — Ambulatory Visit
Admission: RE | Admit: 2011-11-03 | Discharge: 2011-11-03 | Disposition: A | Discharge status: Home / Self Care | Payer: Medicare Other | Source: Ambulatory Visit | Attending: Internal Medicine | Admitting: Internal Medicine

## 2011-11-03 DIAGNOSIS — Z1231 Encounter for screening mammogram for malignant neoplasm of breast: Secondary | ICD-10-CM

## 2011-12-21 ENCOUNTER — Other Ambulatory Visit: Payer: Self-pay | Admitting: Internal Medicine

## 2011-12-23 ENCOUNTER — Other Ambulatory Visit: Payer: Self-pay

## 2011-12-23 MED ORDER — NITROFURANTOIN MACROCRYSTAL 100 MG PO CAPS: 100.0000 mg | ORAL_CAPSULE | Freq: Every day | ORAL | Status: DC

## 2012-02-19 ENCOUNTER — Ambulatory Visit (INDEPENDENT_AMBULATORY_CARE_PROVIDER_SITE_OTHER)
Admission: RE | Admit: 2012-02-19 | Discharge: 2012-02-19 | Disposition: A | Discharge status: Home / Self Care | Payer: Medicare Other | Source: Ambulatory Visit | Attending: Internal Medicine | Admitting: Internal Medicine

## 2012-02-19 ENCOUNTER — Encounter: Payer: Self-pay | Admitting: Internal Medicine

## 2012-02-19 ENCOUNTER — Ambulatory Visit (INDEPENDENT_AMBULATORY_CARE_PROVIDER_SITE_OTHER): Payer: Medicare Other | Admitting: Internal Medicine

## 2012-02-19 ENCOUNTER — Other Ambulatory Visit (INDEPENDENT_AMBULATORY_CARE_PROVIDER_SITE_OTHER): Payer: Medicare Other

## 2012-02-19 VITALS — BP 140/76 | HR 89 | Temp 97.8°F | Resp 16 | Wt 163.0 lb

## 2012-02-19 DIAGNOSIS — Z23 Encounter for immunization: Secondary | ICD-10-CM

## 2012-02-19 DIAGNOSIS — K589 Irritable bowel syndrome without diarrhea: Secondary | ICD-10-CM | POA: Insufficient documentation

## 2012-02-19 DIAGNOSIS — K5900 Constipation, unspecified: Secondary | ICD-10-CM

## 2012-02-19 DIAGNOSIS — R10814 Left lower quadrant abdominal tenderness: Secondary | ICD-10-CM

## 2012-02-19 DIAGNOSIS — K279 Peptic ulcer, site unspecified, unspecified as acute or chronic, without hemorrhage or perforation: Secondary | ICD-10-CM

## 2012-02-19 LAB — COMPREHENSIVE METABOLIC PANEL
ALT: 15 U/L (ref 0–35)
BUN: 15 mg/dL (ref 6–23)
CO2: 29 mEq/L (ref 19–32)
Calcium: 9.7 mg/dL (ref 8.4–10.5)
Creatinine, Ser: 0.9 mg/dL (ref 0.4–1.2)
GFR: 63.82 mL/min (ref >60.00)
Glucose, Bld: 85 mg/dL (ref 70–99)
Total Bilirubin: 0.7 mg/dL (ref 0.3–1.2)

## 2012-02-19 LAB — URINALYSIS, ROUTINE W REFLEX MICROSCOPIC
Bilirubin Urine: NEGATIVE
Nitrite: NEGATIVE
Specific Gravity, Urine: 1.02 (ref 1.000–1.030)
pH: 5.5 (ref 5.0–8.0)

## 2012-02-19 LAB — CBC WITH DIFFERENTIAL/PLATELET
Basophils Absolute: 0.1 10*3/uL (ref 0.0–0.1)
Basophils Relative: 1 % (ref 0.0–3.0)
HCT: 42.3 % (ref 36.0–46.0)
Hemoglobin: 14.1 g/dL (ref 12.0–15.0)
Lymphs Abs: 1.5 10*3/uL (ref 0.7–4.0)
Monocytes Relative: 8 % (ref 3.0–12.0)
Neutro Abs: 6.3 10*3/uL (ref 1.4–7.7)
RBC: 4.93 Mil/uL (ref 3.87–5.11)
RDW: 13.5 % (ref 11.5–14.6)

## 2012-02-19 LAB — AMYLASE: Amylase: 51 U/L (ref 27–131)

## 2012-02-19 MED ORDER — LINACLOTIDE 145 MCG PO CAPS: 145.0000 ug | ORAL_CAPSULE | Freq: Every day | ORAL | Status: DC

## 2012-02-19 NOTE — Assessment & Plan Note (Signed)
I will check a plain film to see if she has a sbo or ileus I will check her labs to look for organic pathology Will treat the constipation as well

## 2012-02-19 NOTE — Assessment & Plan Note (Signed)
She will try linzess I will check her labs today to see if there is a secondary cause for the constipation

## 2012-02-19 NOTE — Patient Instructions (Signed)
Abdominal Pain  Abdominal pain can be caused by many things. Your caregiver decides the seriousness of your pain by an examination and possibly blood tests and X-rays. Many cases can be observed and treated at home. Most abdominal pain is not caused by a disease and will probably improve without treatment. However, in many cases, more time must pass before a clear cause of the pain can be found. Before that point, it may not be known if you need more testing, or if hospitalization or surgery is needed.  HOME CARE INSTRUCTIONS   · Do not take laxatives unless directed by your caregiver.  · Take pain medicine only as directed by your caregiver.  · Only take over-the-counter or prescription medicines for pain, discomfort, or fever as directed by your caregiver.  · Try a clear liquid diet (broth, tea, or water) for as long as directed by your caregiver. Slowly move to a bland diet as tolerated.  SEEK IMMEDIATE MEDICAL CARE IF:   · The pain does not go away.  · You have a fever.  · You keep throwing up (vomiting).  · The pain is felt only in portions of the abdomen. Pain in the right side could possibly be appendicitis. In an adult, pain in the left lower portion of the abdomen could be colitis or diverticulitis.  · You pass bloody or black tarry stools.  MAKE SURE YOU:   · Understand these instructions.  · Will watch your condition.  · Will get help right away if you are not doing well or get worse.  Document Released: 11/05/2004 Document Revised: 04/20/2011 Document Reviewed: 09/14/2007  [employer]® Patient Information ©2013 [employer].

## 2012-02-19 NOTE — Progress Notes (Signed)
Subjective:    Patient ID: Kristin Garcia, female    DOB: September 17, 1945, 67 y.o.   MRN: [id number]  Abdominal Pain This is a recurrent problem. The current episode started more than 1 month ago. The onset quality is gradual. The problem occurs intermittently. The problem has been unchanged. The pain is located in the LLQ. The pain is at a severity of 1/10. The pain is mild. The quality of the pain is aching. The abdominal pain does not radiate. Associated symptoms include constipation and vomiting. Pertinent negatives include no anorexia, arthralgias, belching, diarrhea, dysuria, fever, flatus, frequency, headaches, hematochezia, hematuria, melena, myalgias, nausea or weight loss. Nothing aggravates the pain. The pain is relieved by nothing. Treatments tried: fleets enema, dulcolax. The treatment provided no relief.      Review of Systems  Constitutional: Negative for fever and weight loss.  Gastrointestinal: Positive for vomiting, abdominal pain and constipation. Negative for nausea, diarrhea, melena, hematochezia, anorexia and flatus.  Genitourinary: Negative for dysuria, frequency and hematuria.  Musculoskeletal: Negative for myalgias and arthralgias.  Neurological: Negative for headaches.       Objective:   Physical Exam  Vitals reviewed. Constitutional: She is oriented to person, place, and time. Vital signs are normal. She appears well-developed and well-nourished.  Non-toxic appearance. She does not have a sickly appearance. She does not appear ill. No distress.  HENT:  Head: Normocephalic and atraumatic.  Mouth/Throat: Oropharynx is clear and moist. No oropharyngeal exudate.  Eyes: Conjunctivae normal are normal. Right eye exhibits no discharge. Left eye exhibits no discharge. No scleral icterus.  Neck: Normal range of motion. Neck supple. No JVD present. No tracheal deviation present. No thyromegaly present.  Cardiovascular: Normal rate, regular rhythm, normal heart sounds and  intact distal pulses.  Exam reveals no gallop and no friction rub.   No murmur heard. Pulmonary/Chest: Effort normal and breath sounds normal. No stridor. No respiratory distress. She has no wheezes. She has no rales. She exhibits no tenderness.  Abdominal: Soft. Normal appearance. She exhibits no shifting dullness, no distension, no pulsatile liver, no fluid wave, no abdominal bruit, no ascites, no pulsatile midline mass and no mass. Bowel sounds are decreased. There is no hepatosplenomegaly, splenomegaly or hepatomegaly. There is no tenderness. There is no rigidity, no rebound, no guarding, no CVA tenderness, no tenderness at McBurney's point and negative Murphy's sign. No hernia. Hernia confirmed negative in the ventral area.  Genitourinary: Rectum normal. Rectal exam shows no external hemorrhoid, no internal hemorrhoid, no fissure, no mass, no tenderness and anal tone normal. Guaiac negative stool.  Musculoskeletal: Normal range of motion. She exhibits no edema and no tenderness.  Lymphadenopathy:    She has no cervical adenopathy.  Neurological: She is oriented to person, place, and time.  Skin: Skin is warm and dry. No rash noted. She is not diaphoretic. No erythema. No pallor.  Psychiatric: She has a normal mood and affect. Her behavior is normal. Judgment and thought content normal.      Lab Results  Component Value Date   WBC 6.5 03/25/2011   HGB 13.6 03/25/2011   HCT 41.4 03/25/2011   PLT 317.0 03/25/2011   GLUCOSE 82 03/25/2011   CHOL 280* 07/24/2009   TRIG 105.0 07/24/2009   HDL 99.10 07/24/2009   LDLDIRECT 145.3 07/24/2009   ALT 17 03/25/2011   AST 19 03/25/2011   NA 137 03/25/2011   K 4.6 03/25/2011   CL 101 03/25/2011   CREATININE 0.8 03/25/2011   BUN 13 03/25/2011  CO2 29 03/25/2011   TSH 1.06 03/17/2010      Assessment & Plan:

## 2012-02-19 NOTE — Assessment & Plan Note (Signed)
NED today 

## 2012-03-04 ENCOUNTER — Other Ambulatory Visit (INDEPENDENT_AMBULATORY_CARE_PROVIDER_SITE_OTHER): Payer: Medicare Other

## 2012-03-04 ENCOUNTER — Ambulatory Visit (INDEPENDENT_AMBULATORY_CARE_PROVIDER_SITE_OTHER): Payer: Medicare Other | Admitting: Internal Medicine

## 2012-03-04 ENCOUNTER — Encounter: Payer: Self-pay | Admitting: Internal Medicine

## 2012-03-04 VITALS — BP 128/72 | HR 81 | Temp 97.5°F | Resp 10

## 2012-03-04 DIAGNOSIS — R040 Epistaxis: Secondary | ICD-10-CM | POA: Insufficient documentation

## 2012-03-04 DIAGNOSIS — K5900 Constipation, unspecified: Secondary | ICD-10-CM

## 2012-03-04 DIAGNOSIS — R10814 Left lower quadrant abdominal tenderness: Secondary | ICD-10-CM

## 2012-03-04 LAB — CBC WITH DIFFERENTIAL/PLATELET
Basophils Absolute: 0 10*3/uL (ref 0.0–0.1)
Eosinophils Relative: 3.3 % (ref 0.0–5.0)
HCT: 39.8 % (ref 36.0–46.0)
Lymphocytes Relative: 25.5 % (ref 12.0–46.0)
Lymphs Abs: 2 10*3/uL (ref 0.7–4.0)
Monocytes Relative: 7.8 % (ref 3.0–12.0)
Neutrophils Relative %: 63.1 % (ref 43.0–77.0)
Platelets: 306 10*3/uL (ref 150.0–400.0)
RDW: 13.2 % (ref 11.5–14.6)
WBC: 8 10*3/uL (ref 4.5–10.5)

## 2012-03-04 LAB — TSH: TSH: 0.93 u[IU]/mL (ref 0.35–5.50)

## 2012-03-04 MED ORDER — LUBIPROSTONE 24 MCG PO CAPS: 24.0000 ug | ORAL_CAPSULE | Freq: Two times a day (BID) | ORAL | Status: DC

## 2012-03-04 NOTE — Patient Instructions (Signed)

## 2012-03-04 NOTE — Assessment & Plan Note (Signed)
ENT referral

## 2012-03-04 NOTE — Assessment & Plan Note (Signed)
I am concerned that cymbalta may be contributing to this but she will not stop it for now I will check her TSH level today She will stop linzess and will try [place]

## 2012-03-04 NOTE — Progress Notes (Signed)
Subjective:    Patient ID: Kristin Garcia, female    DOB: [birth year], 67 y.o.   MRN: [id number]  Abdominal Pain This is a recurrent problem. The current episode started more than 1 month ago. The onset quality is gradual. The problem occurs intermittently. The problem has been gradually improving. The pain is located in the LLQ. The pain is at a severity of 1/10. The pain is mild. The quality of the pain is aching. The abdominal pain does not radiate. Associated symptoms include constipation. Pertinent negatives include no anorexia, arthralgias, belching, diarrhea, dysuria, fever, flatus, frequency, headaches, hematochezia, hematuria, melena, myalgias, nausea, vomiting or weight loss. Nothing aggravates the pain. The pain is relieved by bowel movements. She has tried proton pump inhibitors (linzess, enemas, dulcolax) for the symptoms. The treatment provided no relief. Her past medical history is significant for abdominal surgery.      Review of Systems  Constitutional: Negative for fever, chills, weight loss, diaphoresis, activity change, appetite change, fatigue and unexpected weight change.  HENT: Positive for nosebleeds. Negative for hearing loss, ear pain, congestion, sore throat, facial swelling, rhinorrhea, sneezing, drooling, mouth sores, trouble swallowing, neck pain, neck stiffness, dental problem, voice change, postnasal drip, sinus pressure, tinnitus and ear discharge.   Eyes: Negative.   Respiratory: Negative.   Cardiovascular: Negative for chest pain, palpitations and leg swelling.  Gastrointestinal: Positive for abdominal pain and constipation. Negative for nausea, vomiting, diarrhea, blood in stool, melena, hematochezia, abdominal distention, anal bleeding, rectal pain, anorexia and flatus.  Genitourinary: Negative for dysuria, urgency, frequency, hematuria, flank pain, decreased urine volume, enuresis, difficulty urinating and pelvic pain.  Musculoskeletal: Negative for  myalgias, back pain, joint swelling, arthralgias and gait problem.  Skin: Negative for color change, pallor, rash and wound.  Neurological: Negative.  Negative for dizziness, weakness and headaches.  Hematological: Negative for adenopathy. Does not bruise/bleed easily.  Psychiatric/Behavioral: Negative.        Objective:   Physical Exam  Vitals reviewed. Constitutional: She is oriented to person, place, and time. She appears well-developed and well-nourished. No distress.  HENT:  Head: Normocephalic and atraumatic. No trismus in the jaw.  Right Ear: Hearing, tympanic membrane, external ear and ear canal normal.  Left Ear: Hearing, tympanic membrane, external ear and ear canal normal.  Nose: Nose normal. No mucosal edema, rhinorrhea, nose lacerations, sinus tenderness, nasal deformity, septal deviation or nasal septal hematoma. No epistaxis.  No foreign bodies. Right sinus exhibits no maxillary sinus tenderness and no frontal sinus tenderness. Left sinus exhibits no maxillary sinus tenderness and no frontal sinus tenderness.  Mouth/Throat: Oropharynx is clear and moist and mucous membranes are normal. Mucous membranes are not pale, not dry and not cyanotic. No oral lesions. No uvula swelling. No oropharyngeal exudate, posterior oropharyngeal edema, posterior oropharyngeal erythema or tonsillar abscesses.  Eyes: Conjunctivae normal are normal. Right eye exhibits no discharge. Left eye exhibits no discharge. No scleral icterus.  Neck: Normal range of motion. Neck supple. No JVD present. No tracheal deviation present. No thyromegaly present.  Cardiovascular: Normal rate, regular rhythm, normal heart sounds and intact distal pulses.  Exam reveals no gallop and no friction rub.   No murmur heard. Pulmonary/Chest: Effort normal and breath sounds normal. No stridor. No respiratory distress. She has no wheezes. She has no rales.  Abdominal: Soft. Normal appearance and bowel sounds are normal. She  exhibits no shifting dullness, no distension, no pulsatile liver, no fluid wave, no abdominal bruit, no ascites, no pulsatile midline mass and no  mass. There is no hepatosplenomegaly, splenomegaly or hepatomegaly. There is tenderness in the left upper quadrant and left lower quadrant. There is no rigidity, no rebound, no guarding, no CVA tenderness, no tenderness at McBurney's point and negative Murphy's sign. No hernia. Hernia confirmed negative in the ventral area, confirmed negative in the right inguinal area and confirmed negative in the left inguinal area.  Musculoskeletal: Normal range of motion. She exhibits no edema and no tenderness.  Lymphadenopathy:    She has no cervical adenopathy.  Neurological: She is oriented to person, place, and time.  Skin: Skin is warm and dry. No rash noted. She is not diaphoretic. No erythema. No pallor.  Psychiatric: She has a normal mood and affect. Her behavior is normal. Judgment and thought content normal.     Lab Results  Component Value Date   WBC 8.8 02/19/2012   HGB 14.1 02/19/2012   HCT 42.3 02/19/2012   PLT 403.0* 02/19/2012   GLUCOSE 85 02/19/2012   CHOL 280* 07/24/2009   TRIG 105.0 07/24/2009   HDL 99.10 07/24/2009   LDLDIRECT 145.3 07/24/2009   ALT 15 02/19/2012   AST 18 02/19/2012   NA 138 02/19/2012   K 4.6 02/19/2012   CL 103 02/19/2012   CREATININE 0.9 02/19/2012   BUN 15 02/19/2012   CO2 29 02/19/2012   TSH 1.06 03/17/2010   Dg Abd Acute W/chest  02/19/2012  *RADIOLOGY REPORT*  Clinical Data: Abdominal pain left lower quadrant.  ACUTE ABDOMEN SERIES (ABDOMEN 2 VIEW & CHEST 1 VIEW)  Comparison: Chest x-ray 08/24/2011  Findings: Heart size is within normal limits.  Negative for heart failure.  Lungs are clear without infiltrate or effusion.  Normal bowel gas pattern.  Negative for obstruction or ileus.  No free air.  Normal amount of stool is present in the transverse and left colon.  Disc degeneration and spurring L4-5.  IMPRESSION: No active  cardiopulmonary disease.  Stool in the transverse and left colon without bowel obstruction.   Original Report Authenticated By: Janeece Riggers, M.D.     Assessment & Plan:

## 2012-03-04 NOTE — Assessment & Plan Note (Signed)
I will recheck her PLT count today Also, I have asked her to get a CT done to see if there is an obstruction, mass,etc

## 2012-03-09 ENCOUNTER — Ambulatory Visit (INDEPENDENT_AMBULATORY_CARE_PROVIDER_SITE_OTHER)
Admission: RE | Admit: 2012-03-09 | Discharge: 2012-03-09 | Disposition: A | Discharge status: Home / Self Care | Payer: Medicare Other | Source: Ambulatory Visit | Attending: Internal Medicine | Admitting: Internal Medicine

## 2012-03-09 DIAGNOSIS — R10814 Left lower quadrant abdominal tenderness: Secondary | ICD-10-CM

## 2012-04-25 ENCOUNTER — Other Ambulatory Visit: Payer: Self-pay | Admitting: Internal Medicine

## 2012-05-10 ENCOUNTER — Ambulatory Visit (INDEPENDENT_AMBULATORY_CARE_PROVIDER_SITE_OTHER): Payer: Medicare Other | Admitting: Internal Medicine

## 2012-05-10 ENCOUNTER — Encounter: Payer: Self-pay | Admitting: Internal Medicine

## 2012-05-10 VITALS — BP 122/72 | HR 80 | Temp 97.4°F

## 2012-05-10 DIAGNOSIS — J069 Acute upper respiratory infection, unspecified: Secondary | ICD-10-CM

## 2012-05-10 MED ORDER — AZITHROMYCIN 250 MG PO TABS: ORAL_TABLET | ORAL | Status: DC

## 2012-05-10 MED ORDER — HYDROCODONE-HOMATROPINE 5-1.5 MG/5ML PO SYRP: 5.0000 mL | ORAL_SOLUTION | Freq: Three times a day (TID) | ORAL | Status: DC | PRN

## 2012-05-10 NOTE — Progress Notes (Signed)
HPI  Pt presents to the clinic today with c/o cold symptoms x 2 weeks. The worst part is the sore throat and dry cough. She does not produce any sputum. She has been running fevers. He has tried Robitussin, Mucinex, cough drops and nothing seems to help. The cough is worse at night. She has not had much sleep in 3 nights. She does have a history of allergies but no asthma. She does have sick contacts.  Review of Systems      Past Medical History  Diagnosis Date  . Anemia   . Anxiety   . Depression   . Ulcer     Peptic  . Hyperlipidemia   . Arthritis   . OAB (overactive bladder)   . Urinary incontinence   . Interstitial cystitis   . Fibromyalgia     Family History  Problem Relation Age of Onset  . Hypertension Father   . Heart disease Father   . Stomach cancer Paternal Grandmother   . Colon cancer Paternal Aunt   . Colon cancer Maternal Grandmother     History   Social History  . Marital Status: Married    Spouse Name: N/A    Number of Children: 3  . Years of Education: N/A   Occupational History  . Retired-RN    Social History Main Topics  . Smoking status: Never Smoker   . Smokeless tobacco: Never Used     Comment: Regular Exercise - No  . Alcohol Use: 3.5 - 7 oz/week    7-14 drink(s) per week  . Drug Use: No  . Sexually Active: Not Currently   Other Topics Concern  . Not on file   Social History Narrative  . No narrative on file    Allergies  Allergen Reactions  . Iodine     REACTION: Sz and Shock     Constitutional: Positive headache, fatigue and fever. Denies abrupt weight changes.  HEENT:  Positive sore throat. Denies eye redness, eye pain, pressure behind the eyes, facial pain, nasal congestion, ear pain, ringing in the ears, wax buildup, runny nose or bloody nose. Respiratory: Positive cough. Denies difficulty breathing or shortness of breath.  Cardiovascular: Denies chest pain, chest tightness, palpitations or swelling in the hands or feet.    No other specific complaints in a complete review of systems (except as listed in HPI above).  Objective:   BP 122/72  Pulse 80  Temp(Src) 97.4 F (36.3 C) (Oral)  SpO2 95% Wt Readings from Last 3 Encounters:  02/19/12 163 lb (73.936 kg)  05/22/11 160 lb (72.576 kg)  04/06/11 165 lb (74.844 kg)     General: Appears herf stated age, well developed, well nourished in NAD. HEENT: Head: normal shape and size; Eyes: sclera white, no icterus, conjunctiva pink, PERRLA and EOMs intact; Ears: Tm's gray and intact, normal light reflex; Nose: mucosa pink and moist, septum midline; Throat/Mouth: + PND. Teeth present, mucosa erythematous and moist, no exudate noted, no lesions or ulcerations noted.  Neck: Mild cervical lymphadenopathy. Neck supple, trachea midline. No massses, lumps or thyromegaly present.  Cardiovascular: Normal rate and rhythm. S1,S2 noted.  No murmur, rubs or gallops noted. No JVD or BLE edema. No carotid bruits noted. Pulmonary/Chest: Normal effort and positive vesicular breath sounds. No respiratory distress. No wheezes, rales or ronchi noted.      Assessment & Plan:   Upper Respiratory Infection, new onset with additional workup required:  Get some rest and drink plenty of water Do salt  water gargles for the sore throat eRx for Azithromax x 5 days eRx for Hycodan cough syrup  RTC as needed or if symptoms persist.

## 2012-05-10 NOTE — Patient Instructions (Signed)

## 2012-05-18 ENCOUNTER — Other Ambulatory Visit: Payer: Self-pay | Admitting: Internal Medicine

## 2012-05-18 NOTE — Telephone Encounter (Signed)
Rx faxed to [facility]. 

## 2012-06-17 ENCOUNTER — Ambulatory Visit (INDEPENDENT_AMBULATORY_CARE_PROVIDER_SITE_OTHER)
Admission: RE | Admit: 2012-06-17 | Discharge: 2012-06-17 | Disposition: A | Discharge status: Home / Self Care | Payer: Medicare Other | Source: Ambulatory Visit | Attending: Internal Medicine | Admitting: Internal Medicine

## 2012-06-17 ENCOUNTER — Encounter: Payer: Self-pay | Admitting: Internal Medicine

## 2012-06-17 ENCOUNTER — Ambulatory Visit (INDEPENDENT_AMBULATORY_CARE_PROVIDER_SITE_OTHER): Payer: Medicare Other | Admitting: Internal Medicine

## 2012-06-17 ENCOUNTER — Telehealth: Payer: Self-pay

## 2012-06-17 VITALS — BP 122/84 | HR 72 | Temp 98.7°F | Resp 16 | Wt 167.0 lb

## 2012-06-17 DIAGNOSIS — Z23 Encounter for immunization: Secondary | ICD-10-CM | POA: Insufficient documentation

## 2012-06-17 DIAGNOSIS — E785 Hyperlipidemia, unspecified: Secondary | ICD-10-CM | POA: Insufficient documentation

## 2012-06-17 DIAGNOSIS — G4700 Insomnia, unspecified: Secondary | ICD-10-CM

## 2012-06-17 DIAGNOSIS — M5412 Radiculopathy, cervical region: Secondary | ICD-10-CM | POA: Insufficient documentation

## 2012-06-17 DIAGNOSIS — K589 Irritable bowel syndrome without diarrhea: Secondary | ICD-10-CM

## 2012-06-17 DIAGNOSIS — R9431 Abnormal electrocardiogram [ECG] [EKG]: Secondary | ICD-10-CM

## 2012-06-17 DIAGNOSIS — R002 Palpitations: Secondary | ICD-10-CM

## 2012-06-17 DIAGNOSIS — K5900 Constipation, unspecified: Secondary | ICD-10-CM

## 2012-06-17 MED ORDER — PITAVASTATIN CALCIUM 2 MG PO TABS: 1.0000 | ORAL_TABLET | Freq: Every day | ORAL | Status: DC

## 2012-06-17 MED ORDER — ZOLPIDEM TARTRATE 1.75 MG SL SUBL: 1.0000 | SUBLINGUAL_TABLET | Freq: Every day | SUBLINGUAL | Status: DC

## 2012-06-17 MED ORDER — PREGABALIN 50 MG PO CAPS: 50.0000 mg | ORAL_CAPSULE | Freq: Three times a day (TID) | ORAL | Status: DC

## 2012-06-17 MED ORDER — LINACLOTIDE 145 MCG PO CAPS: 145.0000 ug | ORAL_CAPSULE | Freq: Every day | ORAL | Status: AC

## 2012-06-17 NOTE — Assessment & Plan Note (Signed)
EKG shows NSR She saw a Cardiologist in [place], [place] years ago who told her that she has SVT I have asked her to get an event monitor and ECHO done

## 2012-06-17 NOTE — Assessment & Plan Note (Signed)
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

## 2012-06-17 NOTE — Progress Notes (Signed)
Subjective:    Patient ID: Kristin Garcia, female    DOB: October 25, 1945, 67 y.o.   MRN: [id number]  Palpitations  This is a chronic problem. The current episode started more than 1 month ago. The problem occurs intermittently. The problem has been unchanged. Nothing aggravates the symptoms. Associated symptoms include numbness (in her left arm). Pertinent negatives include no anxiety, chest fullness, chest pain, coughing, diaphoresis, dizziness, fever, irregular heartbeat, malaise/fatigue, nausea, near-syncope, shortness of breath, syncope, vomiting or weakness. She has tried nothing for the symptoms. There is no history of anemia, anxiety, drug use, heart disease, hyperthyroidism or a valve disorder.      Review of Systems  Constitutional: Negative.  Negative for fever, chills, malaise/fatigue, diaphoresis, activity change, appetite change, fatigue and unexpected weight change.  HENT: Positive for neck pain (left side, worsening over the last few months) and neck stiffness. Negative for facial swelling.   Eyes: Negative.   Respiratory: Negative.  Negative for apnea, cough, choking, chest tightness, shortness of breath, wheezing and stridor.   Cardiovascular: Positive for palpitations. Negative for chest pain, leg swelling, syncope and near-syncope.  Gastrointestinal: Positive for constipation. Negative for nausea, vomiting, abdominal pain, diarrhea, blood in stool, abdominal distention, anal bleeding and rectal pain.  Endocrine: Negative.   Genitourinary: Negative.   Skin: Negative.   Allergic/Immunologic: Negative.   Neurological: Positive for numbness (in her left arm). Negative for dizziness, tremors, seizures, syncope, speech difficulty, weakness, light-headedness and headaches.  Hematological: Negative.  Negative for adenopathy. Does not bruise/bleed easily.  Psychiatric/Behavioral: Positive for sleep disturbance. Negative for suicidal ideas, dysphoric mood, decreased concentration and  agitation. The patient is not nervous/anxious.        Objective:   Physical Exam  Vitals reviewed. Constitutional: She is oriented to person, place, and time. She appears well-developed and well-nourished.  Non-toxic appearance. She does not have a sickly appearance. She does not appear ill. No distress.  HENT:  Head: Normocephalic and atraumatic.  Mouth/Throat: Oropharynx is clear and moist. No oropharyngeal exudate.  Eyes: Conjunctivae are normal. Right eye exhibits no discharge. Left eye exhibits no discharge. No scleral icterus.  Neck: Normal range of motion. Neck supple. No JVD present. No tracheal deviation present. No thyromegaly present.  Cardiovascular: Normal rate, regular rhythm, normal heart sounds and intact distal pulses.  Exam reveals no gallop and no friction rub.   No murmur heard. Pulmonary/Chest: Effort normal and breath sounds normal. No stridor. No respiratory distress. She has no wheezes. She has no rales. She exhibits no tenderness.  Abdominal: Soft. Bowel sounds are normal. She exhibits no distension and no mass. There is no tenderness. There is no rebound and no guarding.  Musculoskeletal: Normal range of motion. She exhibits no edema and no tenderness.       Cervical back: Normal. She exhibits normal range of motion, no tenderness, no bony tenderness, no swelling, no edema, no deformity, no laceration, no pain, no spasm and normal pulse.  Lymphadenopathy:    She has no cervical adenopathy.  Neurological: She is alert and oriented to person, place, and time. She has normal strength. She displays no atrophy, no tremor and normal reflexes. No cranial nerve deficit or sensory deficit. She exhibits normal muscle tone. She displays a negative Romberg sign. She displays no seizure activity. Coordination and gait normal.  Reflex Scores:      Tricep reflexes are 0 on the right side and 0 on the left side.      Bicep reflexes are 0  on the right side and 0 on the left side.       Brachioradialis reflexes are 0 on the right side and 0 on the left side.      Patellar reflexes are 0 on the right side and 0 on the left side.      Achilles reflexes are 0 on the right side and 0 on the left side. Skin: Skin is warm and dry. No rash noted. She is not diaphoretic. No erythema. No pallor.  Psychiatric: She has a normal mood and affect. Her behavior is normal. Judgment and thought content normal.     Lab Results  Component Value Date   WBC 8.0 03/04/2012   HGB 13.1 03/04/2012   HCT 39.8 03/04/2012   PLT 306.0 03/04/2012   GLUCOSE 85 02/19/2012   CHOL 280* 07/24/2009   TRIG 105.0 07/24/2009   HDL 99.10 07/24/2009   LDLDIRECT 145.3 07/24/2009   ALT 15 02/19/2012   AST 18 02/19/2012   NA 138 02/19/2012   K 4.6 02/19/2012   CL 103 02/19/2012   CREATININE 0.9 02/19/2012   BUN 15 02/19/2012   CO2 29 02/19/2012   TSH 0.93 03/04/2012       Assessment & Plan:

## 2012-06-17 NOTE — Assessment & Plan Note (Signed)
She has had myalgias with some statins so I asked her to try livalo

## 2012-06-17 NOTE — Assessment & Plan Note (Signed)
She wants to stay on intermezzo as needed

## 2012-06-17 NOTE — Assessment & Plan Note (Signed)
She will continue nsaids, will also try lyrica I will check plain film today

## 2012-06-17 NOTE — Assessment & Plan Note (Signed)
Continue linzess 

## 2012-06-17 NOTE — Telephone Encounter (Signed)
Pharmacy called stating that Livalo nor Intermezzo are covered under pt insurance plan. They are requesting MD to send substitutions for both. Thanks

## 2012-06-17 NOTE — Patient Instructions (Addendum)
Palpitations  A palpitation is the feeling that your heartbeat is irregular or is faster than normal. It may feel like your heart is fluttering or skipping a beat. Palpitations are usually not a serious problem. However, in some cases, you may need further medical evaluation. CAUSES  Palpitations can be caused by:  Smoking.  Caffeine or other stimulants, such as diet pills or energy drinks.  Alcohol.  Stress and anxiety.  Strenuous physical activity.  Fatigue.  Certain medicines.  Heart disease, especially if you have a history of arrhythmias. This includes atrial fibrillation, atrial flutter, or supraventricular tachycardia.  An improperly working pacemaker or defibrillator. DIAGNOSIS  To find the cause of your palpitations, your caregiver will take your history and perform a physical exam. Tests may also be done, including:  Electrocardiography (ECG). This test records the heart's electrical activity.  Cardiac monitoring. This allows your caregiver to monitor your heart rate and rhythm in real time.  Holter monitor. This is a portable device that records your heartbeat and can help diagnose heart arrhythmias. It allows your caregiver to track your heart activity for several days, if needed.  Stress tests by exercise or by giving medicine that makes the heart beat faster. TREATMENT  Treatment of palpitations depends on the cause of your symptoms and can vary greatly. Most cases of palpitations do not require any treatment other than time, relaxation, and monitoring your symptoms. Other causes, such as atrial fibrillation, atrial flutter, or supraventricular tachycardia, usually require further treatment. HOME CARE INSTRUCTIONS   Avoid:  Caffeinated coffee, tea, soft drinks, diet pills, and energy drinks.  Chocolate.  Alcohol.  Stop smoking if you smoke.  Reduce your stress and anxiety. Things that can help you relax include:  A method that measures bodily functions so  you can learn to control them (biofeedback).  Yoga.  Meditation.  Physical activity such as swimming, jogging, or walking.  Get plenty of rest and sleep. SEEK MEDICAL CARE IF:   You continue to have a fast or irregular heartbeat beyond 24 hours.  Your palpitations occur more often. SEEK IMMEDIATE MEDICAL CARE IF:  You develop chest pain or shortness of breath.  You have a severe headache.  You feel dizzy, or you faint. MAKE SURE YOU:  Understand these instructions.  Will watch your condition.  Will get help right away if you are not doing well or get worse. Document Released: 01/24/2000 Document Revised: 07/28/2011 Document Reviewed: 03/27/2011 Exit[employer] Patient Information 2013 [employer].  

## 2012-06-20 NOTE — Addendum Note (Signed)
Addended by: Etta Grandchild on: 06/20/2012 11:36 AM   Modules accepted: Orders

## 2012-06-21 ENCOUNTER — Telehealth: Payer: Self-pay

## 2012-06-21 DIAGNOSIS — G4700 Insomnia, unspecified: Secondary | ICD-10-CM

## 2012-06-21 DIAGNOSIS — E785 Hyperlipidemia, unspecified: Secondary | ICD-10-CM

## 2012-06-21 MED ORDER — ATORVASTATIN CALCIUM 40 MG PO TABS: 40.0000 mg | ORAL_TABLET | Freq: Every day | ORAL | Status: AC

## 2012-06-21 MED ORDER — ZOLPIDEM TARTRATE 5 MG PO TABS: 5.0000 mg | ORAL_TABLET | Freq: Every evening | ORAL | Status: AC | PRN

## 2012-06-21 NOTE — Telephone Encounter (Signed)
Phone call from [place] at [facility] in [place] [phone]. Pt's medications Intermezzo and Livalo are not on formulary ( did not give a list of what would be covered). Pt had been getting samples. Please advise.

## 2012-06-21 NOTE — Telephone Encounter (Signed)
done

## 2012-06-27 ENCOUNTER — Ambulatory Visit
Admission: RE | Admit: 2012-06-27 | Discharge: 2012-06-27 | Disposition: A | Discharge status: Home / Self Care | Payer: Medicare Other | Source: Ambulatory Visit | Attending: Internal Medicine | Admitting: Internal Medicine

## 2012-06-27 DIAGNOSIS — M5412 Radiculopathy, cervical region: Secondary | ICD-10-CM

## 2012-06-28 ENCOUNTER — Encounter: Payer: Self-pay | Admitting: Internal Medicine

## 2012-06-29 ENCOUNTER — Telehealth: Payer: Self-pay | Admitting: Internal Medicine

## 2012-06-29 ENCOUNTER — Encounter: Payer: Self-pay | Admitting: Internal Medicine

## 2012-06-29 ENCOUNTER — Ambulatory Visit (HOSPITAL_COMMUNITY): Payer: Medicare Other | Attending: Internal Medicine | Admitting: Radiology

## 2012-06-29 ENCOUNTER — Other Ambulatory Visit (HOSPITAL_COMMUNITY): Payer: Medicare Other

## 2012-06-29 ENCOUNTER — Telehealth: Payer: Self-pay

## 2012-06-29 DIAGNOSIS — R9431 Abnormal electrocardiogram [ECG] [EKG]: Secondary | ICD-10-CM

## 2012-06-29 DIAGNOSIS — R002 Palpitations: Secondary | ICD-10-CM

## 2012-06-29 DIAGNOSIS — E785 Hyperlipidemia, unspecified: Secondary | ICD-10-CM | POA: Insufficient documentation

## 2012-06-29 NOTE — Telephone Encounter (Signed)
sure

## 2012-06-29 NOTE — Telephone Encounter (Signed)
Shelly with [facility] calls, pt had an appt scheduled today for even monitor placement (30 day). This did not happen due to pt having to much going on in her personal life..she will be going out of town soon and on July 5 she is moving to [place]. She is still having papatations nightly. Burnett Harry is asking do you want to wait and refer pt to someone in [place]? Or place a 24 hour holter monitor. Please advise.

## 2012-06-29 NOTE — Telephone Encounter (Signed)
Jasmine December from Cardiology called to let us know Kristin Garcia is going out of town (moving to [place]) and going out of the country on June 15.  She has done the other tests, but cannot do an event monitor. Jasmine December wants to know if an order for the 24 hour or 48 hour monitor can be put in.

## 2012-06-29 NOTE — Progress Notes (Signed)
Echocardiogram performed.  

## 2012-07-06 ENCOUNTER — Encounter: Payer: Self-pay | Admitting: *Deleted

## 2012-07-06 ENCOUNTER — Encounter (INDEPENDENT_AMBULATORY_CARE_PROVIDER_SITE_OTHER): Payer: Medicare Other

## 2012-07-06 DIAGNOSIS — R002 Palpitations: Secondary | ICD-10-CM

## 2012-07-06 NOTE — Progress Notes (Unsigned)
Patient ID: Kristin Garcia, female   DOB: 1945-03-26, 67 y.o.   MRN: [id number] 48 Hour Holter monitor placed on patient.

## 2012-07-07 ENCOUNTER — Encounter: Payer: Self-pay | Admitting: Internal Medicine

## 2012-07-07 ENCOUNTER — Ambulatory Visit (INDEPENDENT_AMBULATORY_CARE_PROVIDER_SITE_OTHER): Payer: Medicare Other | Admitting: Internal Medicine

## 2012-07-07 VITALS — BP 150/72 | HR 76 | Temp 98.1°F | Resp 16 | Wt 165.0 lb

## 2012-07-07 DIAGNOSIS — I10 Essential (primary) hypertension: Secondary | ICD-10-CM

## 2012-07-07 DIAGNOSIS — M5412 Radiculopathy, cervical region: Secondary | ICD-10-CM

## 2012-07-07 DIAGNOSIS — R002 Palpitations: Secondary | ICD-10-CM

## 2012-07-07 MED ORDER — TRAMADOL HCL 50 MG PO TABS: 50.0000 mg | ORAL_TABLET | Freq: Three times a day (TID) | ORAL | Status: AC | PRN

## 2012-07-07 MED ORDER — TRETINOIN 0.025 % EX CREA: TOPICAL_CREAM | Freq: Every day | CUTANEOUS | Status: AC

## 2012-07-07 MED ORDER — NEBIVOLOL HCL 5 MG PO TABS
5.0000 mg | ORAL_TABLET | Freq: Every day | ORAL | Status: AC
Start: 2012-07-07 — End: ?

## 2012-07-07 NOTE — Assessment & Plan Note (Signed)
Her palps have resolved She is currently doing a holter monitor

## 2012-07-07 NOTE — Assessment & Plan Note (Signed)
Will stop lyrica (it did not help) Will try tramadol for pain I have asked her to see pain management to consider ESI

## 2012-07-07 NOTE — Assessment & Plan Note (Signed)
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

## 2012-07-07 NOTE — Patient Instructions (Signed)

## 2012-07-07 NOTE — Progress Notes (Signed)
Subjective:    Patient ID: Kristin Garcia, female    DOB: 1945/03/29, 67 y.o.   MRN: [id number]  Hypertension This is a new problem. The current episode started 1 to 4 weeks ago. The problem has been gradually worsening since onset. The problem is uncontrolled. Associated symptoms include neck pain. Pertinent negatives include no anxiety, blurred vision, chest pain, headaches, malaise/fatigue, orthopnea, palpitations, peripheral edema, PND, shortness of breath or sweats. Past treatments include nothing. Compliance problems include diet and exercise.  Hypertensive end-organ damage includes left ventricular hypertrophy.      Review of Systems  Constitutional: Negative.  Negative for fever, chills, malaise/fatigue, diaphoresis, activity change, appetite change, fatigue and unexpected weight change.  HENT: Positive for neck pain and neck stiffness. Negative for facial swelling.   Eyes: Negative.  Negative for blurred vision.  Respiratory: Negative.  Negative for cough, chest tightness, shortness of breath, wheezing and stridor.   Cardiovascular: Negative.  Negative for chest pain, palpitations, orthopnea, leg swelling and PND.  Gastrointestinal: Negative.  Negative for nausea, vomiting, abdominal pain, diarrhea, constipation and blood in stool.  Endocrine: Negative.   Genitourinary: Negative.   Musculoskeletal: Negative for myalgias, back pain, joint swelling, arthralgias and gait problem.  Skin: Negative.   Allergic/Immunologic: Negative.   Neurological: Negative.  Negative for dizziness, tremors, weakness, light-headedness and headaches.  Hematological: Negative.   Psychiatric/Behavioral: Negative.        Objective:   Physical Exam  Vitals reviewed. Constitutional: She is oriented to person, place, and time. She appears well-developed and well-nourished. No distress.  HENT:  Head: Normocephalic and atraumatic.  Mouth/Throat: No oropharyngeal exudate.  Eyes: Conjunctivae are  normal. Right eye exhibits no discharge. Left eye exhibits no discharge. No scleral icterus.  Neck: Normal range of motion. Neck supple. No JVD present. No tracheal deviation present. No thyromegaly present.  Cardiovascular: Normal rate, regular rhythm, normal heart sounds and intact distal pulses.  Exam reveals no gallop and no friction rub.   No murmur heard. Pulmonary/Chest: Effort normal and breath sounds normal. No stridor. No respiratory distress. She has no wheezes. She has no rales. She exhibits no tenderness.  Abdominal: Soft. Bowel sounds are normal. She exhibits no distension and no mass. There is no tenderness. There is no rebound and no guarding.  Musculoskeletal: Normal range of motion. She exhibits no edema and no tenderness.       Cervical back: Normal. She exhibits normal range of motion, no tenderness, no bony tenderness, no swelling, no edema, no deformity, no laceration, no pain, no spasm and normal pulse.  Lymphadenopathy:    She has no cervical adenopathy.  Neurological: She is oriented to person, place, and time. She has normal strength. She displays no atrophy, no tremor and normal reflexes. No cranial nerve deficit or sensory deficit. She exhibits normal muscle tone. She displays a negative Romberg sign. She displays no seizure activity. Coordination and gait normal.  Skin: Skin is warm and dry. No rash noted. She is not diaphoretic. No erythema. No pallor.  Psychiatric: She has a normal mood and affect. Her behavior is normal. Judgment and thought content normal.     Lab Results  Component Value Date   WBC 8.0 03/04/2012   HGB 13.1 03/04/2012   HCT 39.8 03/04/2012   PLT 306.0 03/04/2012   GLUCOSE 85 02/19/2012   CHOL 280* 07/24/2009   TRIG 105.0 07/24/2009   HDL 99.10 07/24/2009   LDLDIRECT 145.3 07/24/2009   ALT 15 02/19/2012   AST 18  02/19/2012   NA 138 02/19/2012   K 4.6 02/19/2012   CL 103 02/19/2012   CREATININE 0.9 02/19/2012   BUN 15 02/19/2012   CO2 29 02/19/2012    TSH 0.93 03/04/2012       Assessment & Plan:

## 2012-07-11 ENCOUNTER — Other Ambulatory Visit: Payer: Self-pay | Admitting: *Deleted

## 2012-07-11 NOTE — Telephone Encounter (Signed)
Receive fax neding Pa on her Tretinoin cream. Notified insurance fax over PA form completed and fax back. Waiting on approval status...[id number]

## 2012-07-12 NOTE — Telephone Encounter (Signed)
Received PA back med has been approve. Notified pharmacy spoke with Kristin Garcia gave approval status,,,lmb

## 2012-09-14 ENCOUNTER — Other Ambulatory Visit: Payer: Self-pay

## 2012-12-15 ENCOUNTER — Other Ambulatory Visit: Payer: Self-pay

## 2012-12-20 ENCOUNTER — Other Ambulatory Visit: Payer: Self-pay | Admitting: Internal Medicine

## 2013-02-12 IMAGING — CR DG ABDOMEN ACUTE W/ 1V CHEST
3 series · 3 of 3 positions shown · non-contrast
Comparison: Chest x-ray 08/24/2011

CLINICAL DATA: Abdominal pain left lower quadrant.

ACUTE ABDOMEN SERIES (ABDOMEN 2 VIEW & CHEST 1 VIEW)

[view not recorded (1 of 3)]
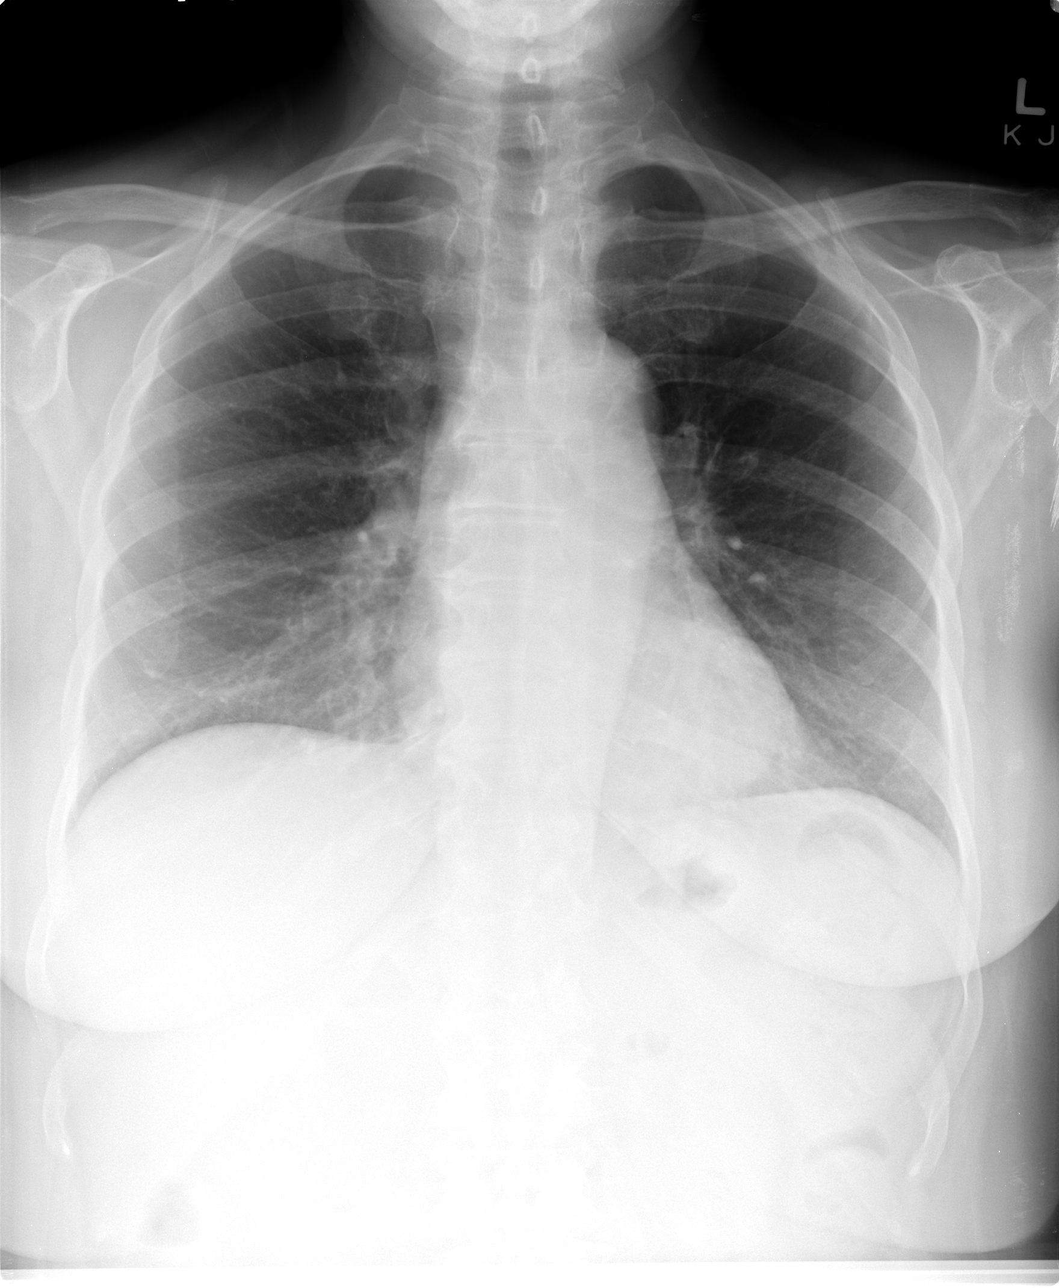

[view not recorded (2 of 3)]
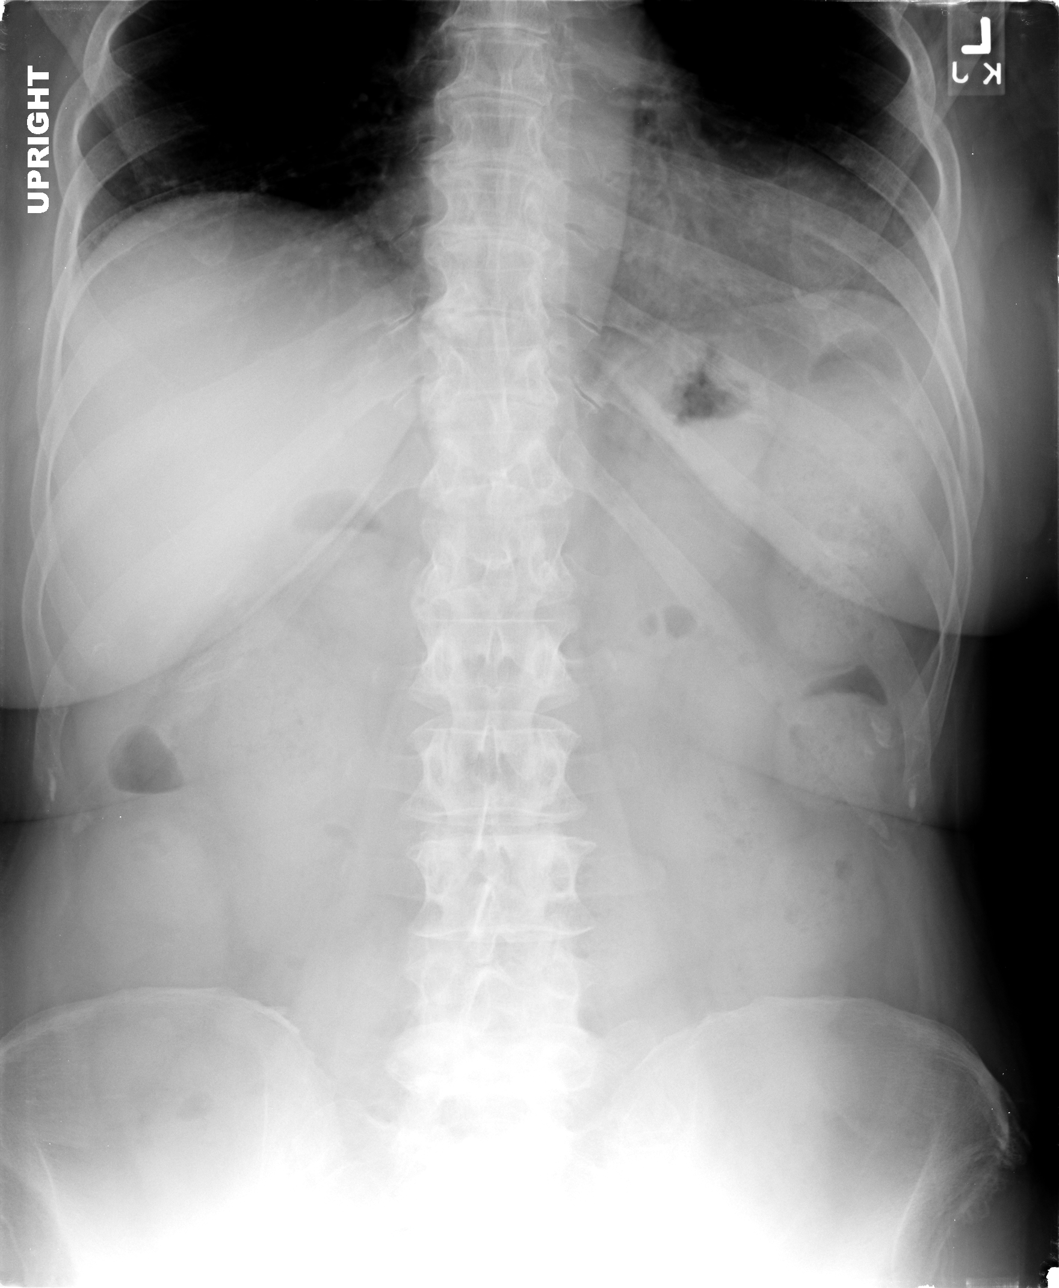

[view not recorded (3 of 3)]
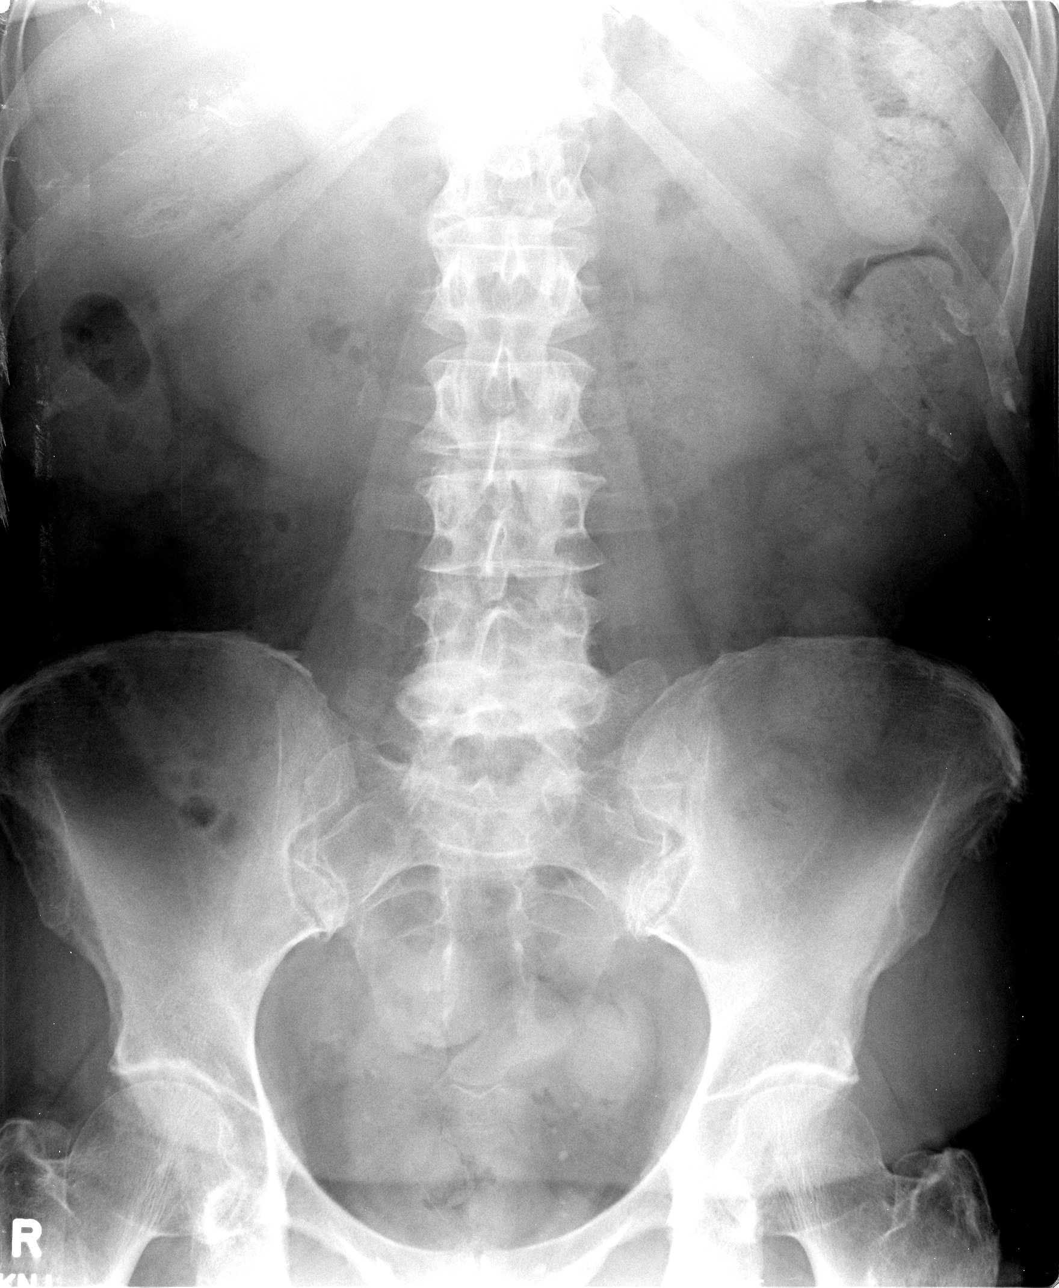

[3 of 3 positions shown; findings below may reference images not displayed]

FINDINGS: Heart size is within normal limits.  Negative for heart
failure.  Lungs are clear without infiltrate or effusion.

Normal bowel gas pattern.  Negative for obstruction or ileus.  No
free air.  Normal amount of stool is present in the transverse and
left colon.  Disc degeneration and spurring L4-5.
IMPRESSION: No active cardiopulmonary disease.

Stool in the transverse and left colon without bowel obstruction.

## 2013-03-03 IMAGING — CT CT ABD-PELV W/O CM
2 of 4 series · 17 of 46 positions shown, 19 images · non-contrast
Comparison: No priors.

CLINICAL DATA: Left lower quadrant abdominal pain.  Severe
constipation.

CT ABDOMEN AND PELVIS WITHOUT CONTRAST
TECHNIQUE: Multidetector CT imaging of the abdomen and pelvis was
performed following the standard protocol without intravenous
contrast.

[Series 2: ap without · axial · non-contrast · 0.68mm/px · z∈[-475,-50]mm · 14 of 93 slices shown, 16 images]
[im 4/93  soft-tissue]
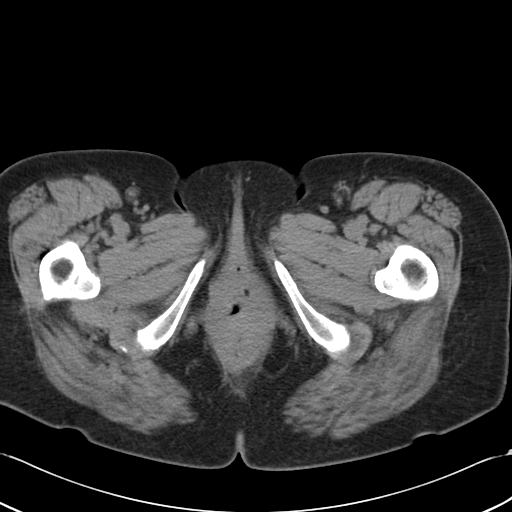
[im 4/93  bone]
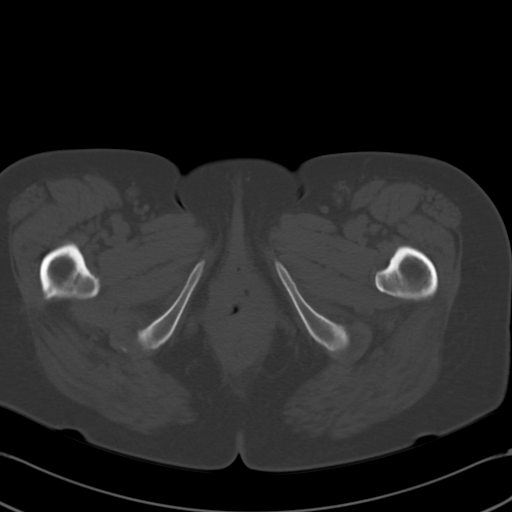
[im 12/93  soft-tissue]
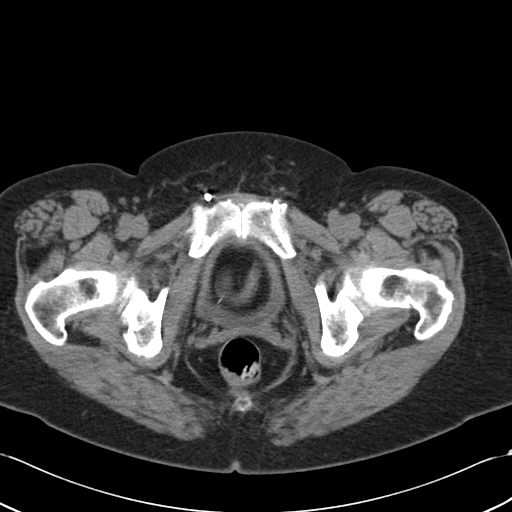
[im 19/93  soft-tissue]
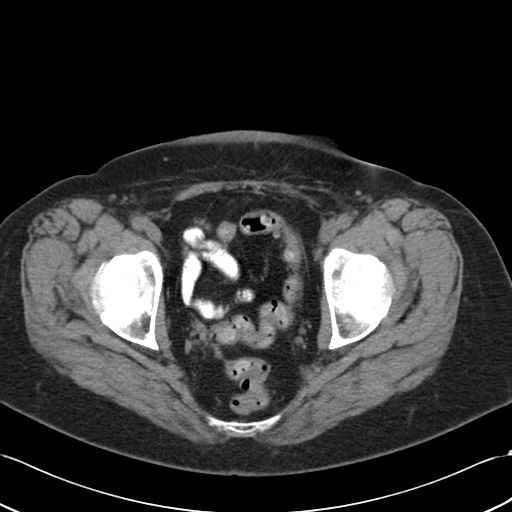
[im 26/93  soft-tissue]
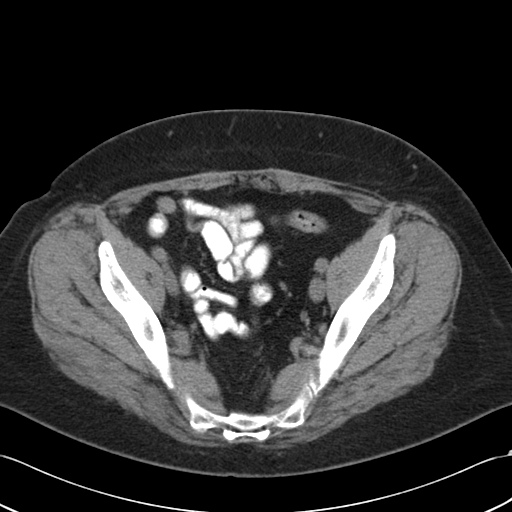
[im 30/93  soft-tissue]
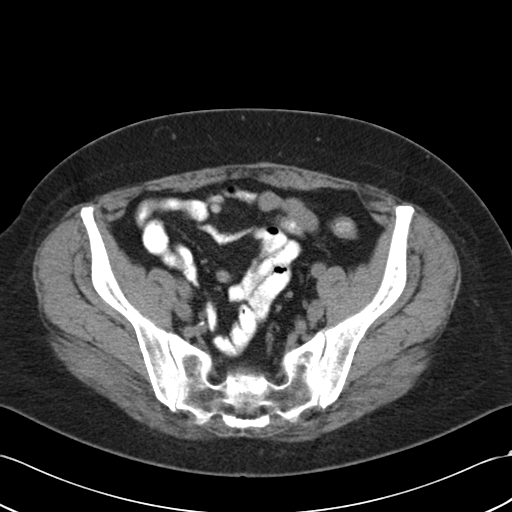
[im 37/93  soft-tissue]
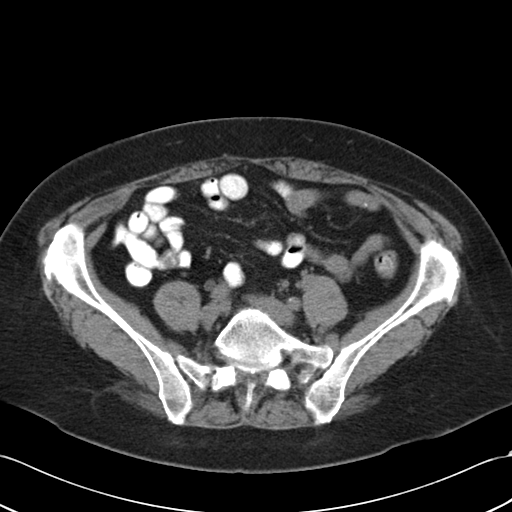
[im 45/93  soft-tissue]
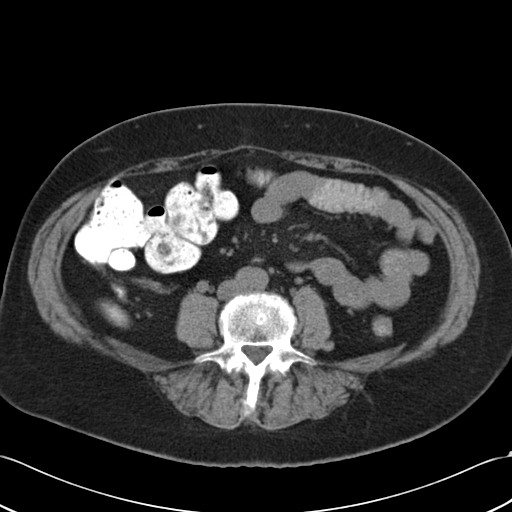
[im 48/93  soft-tissue]
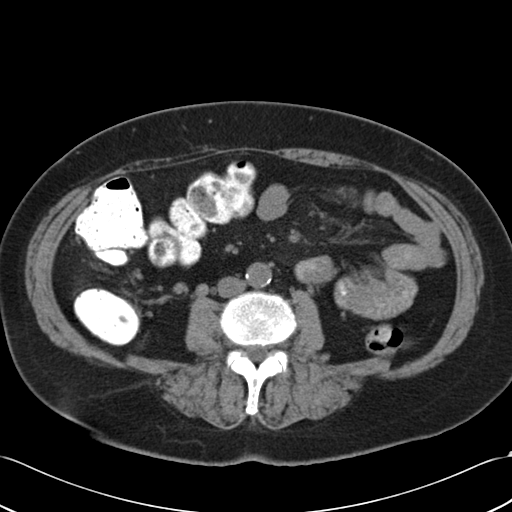
[im 56/93  soft-tissue]
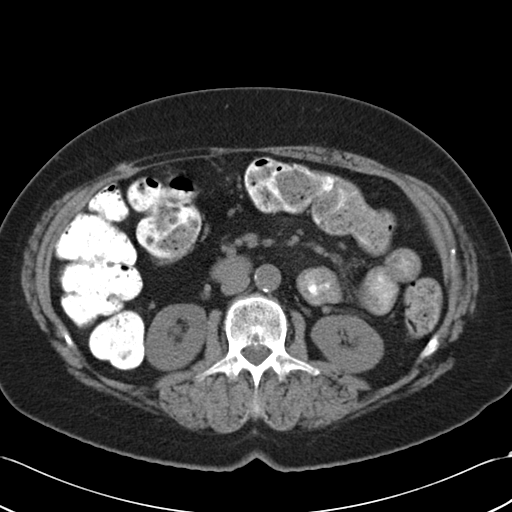
[im 56/93  bone]
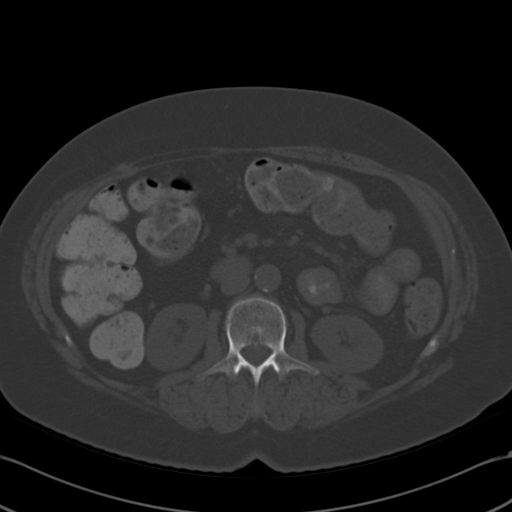
[im 63/93  soft-tissue]
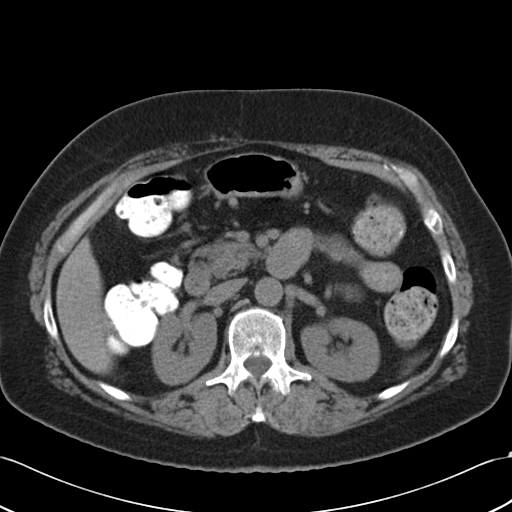
[im 70/93  soft-tissue]
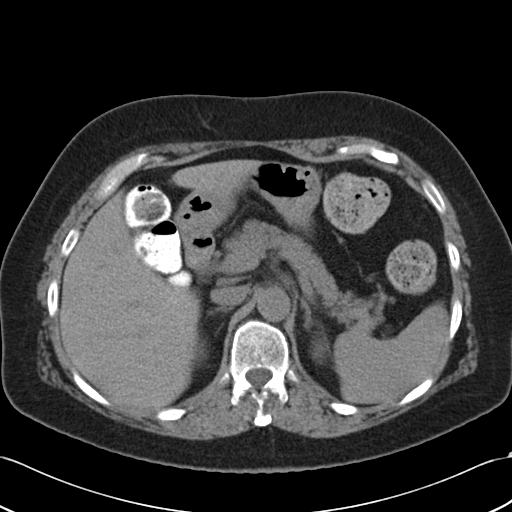
[im 74/93  soft-tissue]
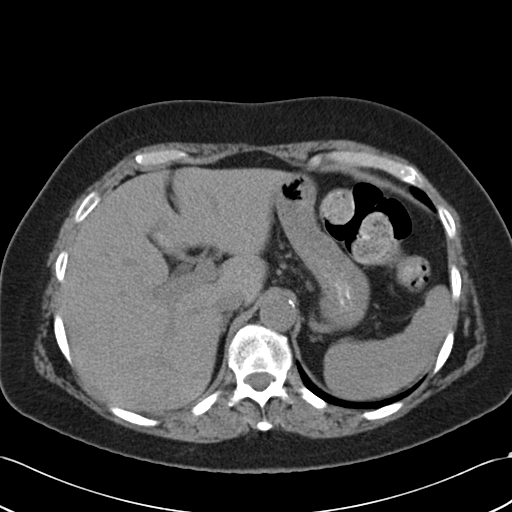
[im 81/93  soft-tissue]
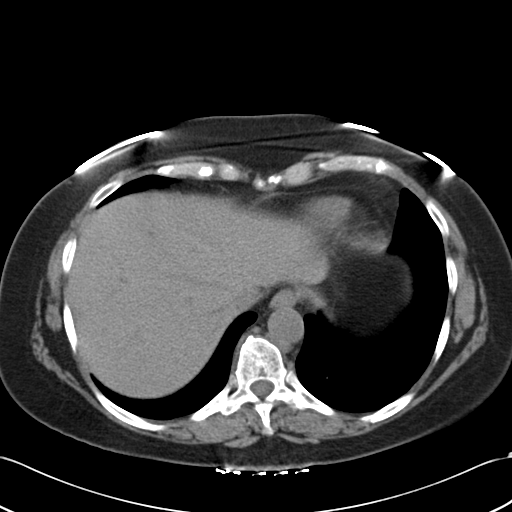
[im 89/93  soft-tissue]
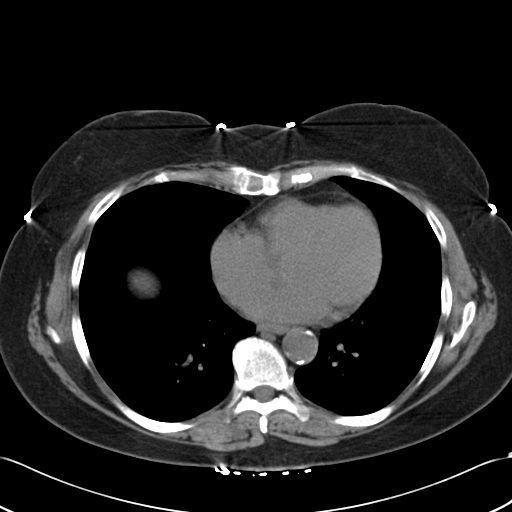

[Series 602: cor · coronal · 0.94mm/px · 3 of 113 slices shown]
[im 38/113  soft-tissue]
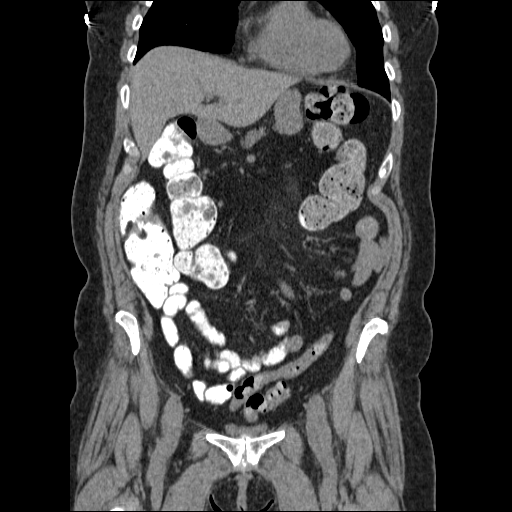
[im 50/113  soft-tissue]
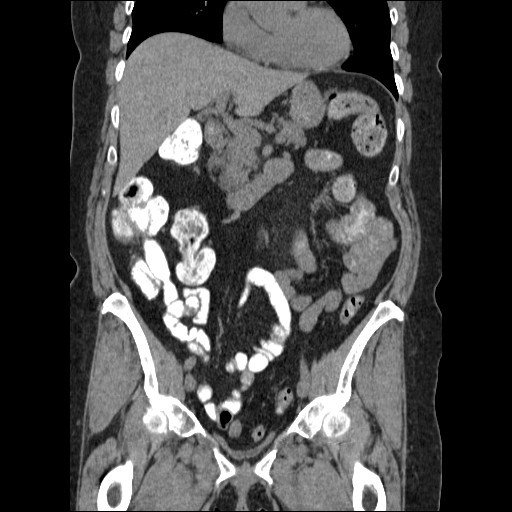
[im 63/113  soft-tissue]
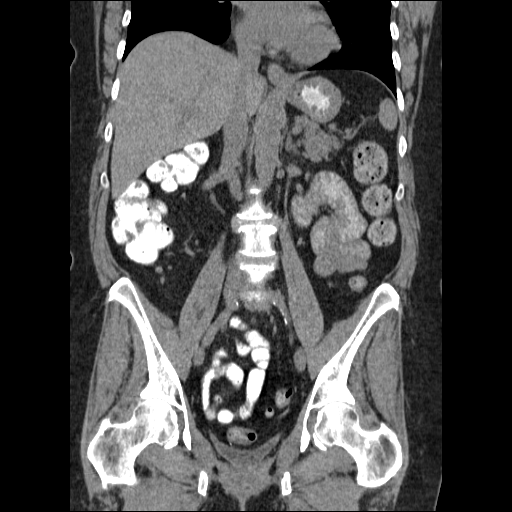

[17 of 46 positions shown; findings below may reference images not displayed]

FINDINGS: Lung Bases: Unremarkable.

Abdomen/Pelvis:  The gallbladder is not visualized and is likely
surgically absent (no surgical clips are noted in the region of the
gallbladder fossa).  The unenhanced appearance of the liver,
pancreas, spleen, bilateral adrenal glands and bilateral kidneys is
unremarkable.  Mild atherosclerosis is noted throughout the
abdominal and pelvic vasculature, without definite aneurysm.
Status post hysterectomy.  Ovaries are not confidently identified
and may be surgically absent or atrophic.  There are a few colonic
diverticula, without surrounding inflammatory changes to suggest
acute diverticulitis at this time.  No ascites.  No
pneumoperitoneum.  No pathologic distension of small bowel.
Urinary bladder is completely decompressed.

Musculoskeletal: Advanced degenerative disc disease at L4-L5. There
are no aggressive appearing lytic or blastic lesions noted in the
visualized portions of the skeleton.
IMPRESSION: 1.  No acute findings in the abdomen or pelvis to account for the
patient's symptoms.
2.  Mild colonic diverticulosis without findings to suggest acute
diverticulitis at this time.
3.  Mild atherosclerosis.
4.  Additional incidental findings, as above.

## 2013-03-16 ENCOUNTER — Other Ambulatory Visit: Payer: Self-pay | Admitting: Internal Medicine

## 2013-03-16 NOTE — Telephone Encounter (Signed)
Last filled 12/2012 #90--please advise

## 2014-08-06 ENCOUNTER — Other Ambulatory Visit: Payer: Self-pay
